# Patient Record
Sex: Female | Born: 1950 | Marital: Married | Smoking: Former smoker
Health system: Southern US, Community
[De-identification: ages and names within clinical notes are randomized; demographics above are authoritative.]

## PROBLEM LIST (undated history)

## (undated) DIAGNOSIS — E7800 Pure hypercholesterolemia, unspecified: Secondary | ICD-10-CM

## (undated) DIAGNOSIS — J189 Pneumonia, unspecified organism: Secondary | ICD-10-CM

## (undated) DIAGNOSIS — F32A Depression, unspecified: Secondary | ICD-10-CM

## (undated) DIAGNOSIS — K219 Gastro-esophageal reflux disease without esophagitis: Secondary | ICD-10-CM

## (undated) DIAGNOSIS — R519 Headache, unspecified: Secondary | ICD-10-CM

## (undated) DIAGNOSIS — M1990 Unspecified osteoarthritis, unspecified site: Secondary | ICD-10-CM

## (undated) DIAGNOSIS — I10 Essential (primary) hypertension: Secondary | ICD-10-CM

## (undated) DIAGNOSIS — I2510 Atherosclerotic heart disease of native coronary artery without angina pectoris: Secondary | ICD-10-CM

## (undated) DIAGNOSIS — I219 Acute myocardial infarction, unspecified: Secondary | ICD-10-CM

## (undated) HISTORY — PX: APPENDECTOMY: SHX54

## (undated) HISTORY — PX: EYE SURGERY: SHX253

## (undated) HISTORY — PX: DIAGNOSTIC LAPAROSCOPY: SUR761

## (undated) HISTORY — PX: ABDOMINAL HYSTERECTOMY: SHX81

## (undated) HISTORY — PX: TONSILLECTOMY: SUR1361

## (undated) HISTORY — PX: CARDIAC CATHETERIZATION: SHX172

## (undated) HISTORY — PX: CERVICAL SPINE SURGERY: SHX589

## (undated) HISTORY — PX: REFRACTIVE SURGERY: SHX103

## (undated) HISTORY — PX: FOOT FRACTURE SURGERY: SHX645

## (undated) HISTORY — PX: CHOLECYSTECTOMY: SHX55

---

## 2017-10-25 ENCOUNTER — Other Ambulatory Visit (HOSPITAL_BASED_OUTPATIENT_CLINIC_OR_DEPARTMENT_OTHER): Payer: Self-pay | Admitting: Neurosurgery

## 2017-10-25 DIAGNOSIS — M542 Cervicalgia: Secondary | ICD-10-CM

## 2018-01-23 ENCOUNTER — Other Ambulatory Visit (HOSPITAL_BASED_OUTPATIENT_CLINIC_OR_DEPARTMENT_OTHER): Payer: Self-pay

## 2020-08-25 ENCOUNTER — Other Ambulatory Visit: Payer: Self-pay | Admitting: Neurosurgery

## 2020-08-25 ENCOUNTER — Other Ambulatory Visit (HOSPITAL_COMMUNITY): Payer: Self-pay | Admitting: Neurosurgery

## 2020-08-25 DIAGNOSIS — S129XXA Fracture of neck, unspecified, initial encounter: Secondary | ICD-10-CM

## 2020-09-06 ENCOUNTER — Ambulatory Visit (HOSPITAL_COMMUNITY)
Admission: RE | Admit: 2020-09-06 | Discharge: 2020-09-06 | Disposition: A | Discharge status: Home / Self Care | Payer: Medicare Other | Source: Ambulatory Visit | Attending: Neurosurgery | Admitting: Neurosurgery

## 2020-09-06 ENCOUNTER — Other Ambulatory Visit: Payer: Self-pay

## 2020-09-06 DIAGNOSIS — S129XXA Fracture of neck, unspecified, initial encounter: Secondary | ICD-10-CM | POA: Diagnosis present

## 2020-09-15 ENCOUNTER — Other Ambulatory Visit: Payer: Self-pay | Admitting: Neurosurgery

## 2020-10-17 ENCOUNTER — Other Ambulatory Visit (HOSPITAL_COMMUNITY)
Admission: RE | Admit: 2020-10-17 | Discharge: 2020-10-17 | Disposition: A | Discharge status: Home / Self Care | Payer: Medicare Other | Source: Ambulatory Visit | Attending: Neurosurgery | Admitting: Neurosurgery

## 2020-10-17 DIAGNOSIS — Z01812 Encounter for preprocedural laboratory examination: Secondary | ICD-10-CM | POA: Diagnosis present

## 2020-10-17 DIAGNOSIS — Z20822 Contact with and (suspected) exposure to covid-19: Secondary | ICD-10-CM | POA: Diagnosis not present

## 2020-10-17 LAB — SARS CORONAVIRUS 2 (TAT 6-24 HRS): SARS Coronavirus 2: NEGATIVE

## 2020-10-19 NOTE — Anesthesia Preprocedure Evaluation (Addendum)
Anesthesia Evaluation  Patient identified by MRN, date of birth, ID band Patient awake    Reviewed: Allergy & Precautions, NPO status , Patient's Chart, lab work & pertinent test results, reviewed documented beta blocker date and time   History of Anesthesia Complications Negative for: history of anesthetic complications  Airway Mallampati: III  TM Distance: >3 FB Neck ROM: Full    Dental  (+) Missing,    Pulmonary neg pulmonary ROS,    Pulmonary exam normal        Cardiovascular hypertension, Pt. on medications and Pt. on home beta blockers Normal cardiovascular exam     Neuro/Psych Cervical stenosis    GI/Hepatic Neg liver ROS, GERD  Medicated and Controlled,  Endo/Other  negative endocrine ROS  Renal/GU negative Renal ROS  negative genitourinary   Musculoskeletal negative musculoskeletal ROS (+)   Abdominal   Peds  Hematology negative hematology ROS (+)   Anesthesia Other Findings Day of surgery medications reviewed with patient.  Reproductive/Obstetrics negative OB ROS                            Anesthesia Physical Anesthesia Plan  ASA: II  Anesthesia Plan: General   Post-op Pain Management:    Induction: Intravenous  PONV Risk Score and Plan: 4 or greater and Treatment may vary due to age or medical condition, Ondansetron and Dexamethasone  Airway Management Planned: Oral ETT  Additional Equipment: None  Intra-op Plan:   Post-operative Plan: Extubation in OR  Informed Consent: I have reviewed the patients History and Physical, chart, labs and discussed the procedure including the risks, benefits and alternatives for the proposed anesthesia with the patient or authorized representative who has indicated his/her understanding and acceptance.     Dental advisory given  Plan Discussed with: CRNA  Anesthesia Plan Comments:        Anesthesia Quick Evaluation

## 2020-10-19 NOTE — Progress Notes (Signed)
I was unable to reach patient by phone.  I left  A message on voice mail.  I instructed the patient to arrive at [facility] Main entrance at 07 30 , register in the Admitting Office. DO NOT eat or drink anything after midnight.  I instructed the patient to take the following medications in the am with just enough water to get them down:   Atorvastatin, Wellburtin, Metoprolol, Protonix, Zoloft.; if needed may take Tylenol ,Flexeril, Tramadol. I asked patient to shower with antibiotic soap,not wear any lotions, powders, cologne, jewelry, piercing, make-up or nail polish.  Wear clean clothes. Brush teeth.  I instructed  patient to call [phone], in the am if there were any questions or problems.

## 2020-10-20 ENCOUNTER — Ambulatory Visit (HOSPITAL_COMMUNITY): Payer: Medicare Other | Admitting: Certified Registered Nurse Anesthetist

## 2020-10-20 ENCOUNTER — Encounter (HOSPITAL_COMMUNITY): Payer: Self-pay | Admitting: Neurosurgery

## 2020-10-20 ENCOUNTER — Ambulatory Visit (HOSPITAL_COMMUNITY)
Admission: RE | Admit: 2020-10-20 | Discharge: 2020-10-21 | Disposition: A | Discharge status: Home / Self Care | Payer: Medicare Other | Attending: Neurosurgery | Admitting: Neurosurgery

## 2020-10-20 ENCOUNTER — Other Ambulatory Visit: Payer: Self-pay

## 2020-10-20 ENCOUNTER — Encounter (HOSPITAL_COMMUNITY)
Admission: RE | Disposition: A | Discharge status: Home / Self Care | Payer: Self-pay | Source: Home / Self Care | Attending: Neurosurgery

## 2020-10-20 ENCOUNTER — Ambulatory Visit (HOSPITAL_COMMUNITY): Payer: Medicare Other

## 2020-10-20 DIAGNOSIS — I252 Old myocardial infarction: Secondary | ICD-10-CM | POA: Diagnosis not present

## 2020-10-20 DIAGNOSIS — Z87891 Personal history of nicotine dependence: Secondary | ICD-10-CM | POA: Insufficient documentation

## 2020-10-20 DIAGNOSIS — M4802 Spinal stenosis, cervical region: Secondary | ICD-10-CM | POA: Insufficient documentation

## 2020-10-20 DIAGNOSIS — M4722 Other spondylosis with radiculopathy, cervical region: Secondary | ICD-10-CM | POA: Insufficient documentation

## 2020-10-20 DIAGNOSIS — Z791 Long term (current) use of non-steroidal anti-inflammatories (NSAID): Secondary | ICD-10-CM | POA: Insufficient documentation

## 2020-10-20 DIAGNOSIS — Z79899 Other long term (current) drug therapy: Secondary | ICD-10-CM | POA: Insufficient documentation

## 2020-10-20 DIAGNOSIS — Z419 Encounter for procedure for purposes other than remedying health state, unspecified: Secondary | ICD-10-CM

## 2020-10-20 HISTORY — DX: Pure hypercholesterolemia, unspecified: E78.00

## 2020-10-20 HISTORY — DX: Acute myocardial infarction, unspecified: I21.9

## 2020-10-20 HISTORY — DX: Essential (primary) hypertension: I10

## 2020-10-20 HISTORY — DX: Gastro-esophageal reflux disease without esophagitis: K21.9

## 2020-10-20 HISTORY — DX: Depression, unspecified: F32.A

## 2020-10-20 HISTORY — PX: ANTERIOR CERVICAL DECOMP/DISCECTOMY FUSION: SHX1161

## 2020-10-20 HISTORY — DX: Unspecified osteoarthritis, unspecified site: M19.90

## 2020-10-20 HISTORY — DX: Atherosclerotic heart disease of native coronary artery without angina pectoris: I25.10

## 2020-10-20 LAB — CBC
HCT: 37.9 % (ref 36.0–46.0)
Hemoglobin: 12.1 g/dL (ref 12.0–15.0)
MCH: 28.7 pg (ref 26.0–34.0)
MCHC: 31.9 g/dL (ref 30.0–36.0)
MCV: 90 fL (ref 80.0–100.0)
Platelets: 194 10*3/uL (ref 150–400)
RBC: 4.21 MIL/uL (ref 3.87–5.11)
RDW: 12.7 % (ref 11.5–15.5)
WBC: 6.2 10*3/uL (ref 4.0–10.5)
nRBC: 0 % (ref 0.0–0.2)

## 2020-10-20 LAB — BASIC METABOLIC PANEL
Anion gap: 9 (ref 5–15)
BUN: 17 mg/dL (ref 8–23)
CO2: 23 mmol/L (ref 22–32)
Calcium: 9.6 mg/dL (ref 8.9–10.3)
Chloride: 102 mmol/L (ref 98–111)
Creatinine, Ser: 0.94 mg/dL (ref 0.44–1.00)
GFR, Estimated: >60 mL/min (ref >60)
Glucose, Bld: 102 mg/dL — ABNORMAL HIGH (ref 70–99)
Potassium: 4.8 mmol/L (ref 3.5–5.1)
Sodium: 134 mmol/L — ABNORMAL LOW (ref 135–145)

## 2020-10-20 LAB — SURGICAL PCR SCREEN
MRSA, PCR: NEGATIVE
Staphylococcus aureus: POSITIVE — AB

## 2020-10-20 SURGERY — ANTERIOR CERVICAL DECOMPRESSION/DISCECTOMY FUSION 1 LEVEL/HARDWARE REMOVAL
Anesthesia: General

## 2020-10-20 MED ORDER — ONDANSETRON HCL 4 MG/2ML IJ SOLN
INTRAMUSCULAR | Status: AC
  Filled 2020-10-20: qty 2

## 2020-10-20 MED ORDER — ORAL CARE MOUTH RINSE: 15.0000 mL | Freq: Once | OROMUCOSAL | Status: AC

## 2020-10-20 MED ORDER — ALUM & MAG HYDROXIDE-SIMETH 200-200-20 MG/5ML PO SUSP: 30.0000 mL | Freq: Four times a day (QID) | ORAL | Status: DC | PRN

## 2020-10-20 MED ORDER — ONDANSETRON HCL 4 MG/2ML IJ SOLN
INTRAMUSCULAR | Status: DC | PRN
  Administered 2020-10-20: 4 mg via INTRAVENOUS

## 2020-10-20 MED ORDER — THROMBIN 5000 UNITS EX SOLR
OROMUCOSAL | Status: DC | PRN
  Administered 2020-10-20: 5 mL via TOPICAL

## 2020-10-20 MED ORDER — LACTATED RINGERS IV SOLN: INTRAVENOUS | Status: DC

## 2020-10-20 MED ORDER — MENTHOL 3 MG MT LOZG: 1.0000 | LOZENGE | OROMUCOSAL | Status: DC | PRN

## 2020-10-20 MED ORDER — SUGAMMADEX SODIUM 200 MG/2ML IV SOLN
INTRAVENOUS | Status: DC | PRN
  Administered 2020-10-20: 300 mg via INTRAVENOUS

## 2020-10-20 MED ORDER — ACETAMINOPHEN 325 MG PO TABS
650.0000 mg | ORAL_TABLET | ORAL | Status: DC | PRN
  Administered 2020-10-21 (×2): 650 mg via ORAL
  Filled 2020-10-20 (×2): qty 2

## 2020-10-20 MED ORDER — PROMETHAZINE HCL 25 MG/ML IJ SOLN: 6.2500 mg | INTRAMUSCULAR | Status: DC | PRN

## 2020-10-20 MED ORDER — METOPROLOL SUCCINATE ER 25 MG PO TB24: 25.0000 mg | ORAL_TABLET | Freq: Every day | ORAL | Status: DC

## 2020-10-20 MED ORDER — CHLORHEXIDINE GLUCONATE 0.12 % MT SOLN
OROMUCOSAL | Status: AC
  Administered 2020-10-20: 15 mL via OROMUCOSAL
  Filled 2020-10-20: qty 15

## 2020-10-20 MED ORDER — SERTRALINE HCL 50 MG PO TABS
50.0000 mg | ORAL_TABLET | Freq: Every day | ORAL | Status: DC
Start: 2020-10-20 — End: 2020-10-21
  Administered 2020-10-20: 50 mg via ORAL
  Filled 2020-10-20: qty 1

## 2020-10-20 MED ORDER — RISAQUAD PO CAPS
1.0000 | ORAL_CAPSULE | Freq: Every day | ORAL | Status: DC
  Filled 2020-10-20: qty 1

## 2020-10-20 MED ORDER — ONDANSETRON HCL 4 MG PO TABS: 4.0000 mg | ORAL_TABLET | Freq: Four times a day (QID) | ORAL | Status: DC | PRN

## 2020-10-20 MED ORDER — CEFAZOLIN SODIUM-DEXTROSE 2-4 GM/100ML-% IV SOLN
2.0000 g | INTRAVENOUS | Status: AC
  Administered 2020-10-20: 2 g via INTRAVENOUS

## 2020-10-20 MED ORDER — HEMOSTATIC AGENTS (NO CHARGE) OPTIME
TOPICAL | Status: DC | PRN
Start: 2020-10-20 — End: 2020-10-20
  Administered 2020-10-20: 1 via TOPICAL

## 2020-10-20 MED ORDER — BUPROPION HCL ER (XL) 150 MG PO TB24
150.0000 mg | ORAL_TABLET | Freq: Every day | ORAL | Status: DC
  Filled 2020-10-20: qty 1

## 2020-10-20 MED ORDER — PROPOFOL 10 MG/ML IV BOLUS
INTRAVENOUS | Status: AC
  Filled 2020-10-20: qty 20

## 2020-10-20 MED ORDER — ROCURONIUM BROMIDE 10 MG/ML (PF) SYRINGE
PREFILLED_SYRINGE | INTRAVENOUS | Status: DC | PRN
  Administered 2020-10-20: 50 mg via INTRAVENOUS
  Administered 2020-10-20: 30 mg via INTRAVENOUS

## 2020-10-20 MED ORDER — SODIUM CHLORIDE 0.9 % IV SOLN: 250.0000 mL | INTRAVENOUS | Status: DC

## 2020-10-20 MED ORDER — FENTANYL CITRATE (PF) 250 MCG/5ML IJ SOLN
INTRAMUSCULAR | Status: DC | PRN
  Administered 2020-10-20 (×2): 50 ug via INTRAVENOUS
  Administered 2020-10-20: 100 ug via INTRAVENOUS
  Administered 2020-10-20: 50 ug via INTRAVENOUS

## 2020-10-20 MED ORDER — OXYCODONE HCL 5 MG PO TABS
5.0000 mg | ORAL_TABLET | Freq: Once | ORAL | Status: DC | PRN
Start: 2020-10-20 — End: 2020-10-20

## 2020-10-20 MED ORDER — SODIUM CHLORIDE 0.9% FLUSH
3.0000 mL | Freq: Two times a day (BID) | INTRAVENOUS | Status: DC
  Administered 2020-10-20: 3 mL via INTRAVENOUS

## 2020-10-20 MED ORDER — 0.9 % SODIUM CHLORIDE (POUR BTL) OPTIME
TOPICAL | Status: DC | PRN
  Administered 2020-10-20: 1000 mL

## 2020-10-20 MED ORDER — PHENOL 1.4 % MT LIQD: 1.0000 | OROMUCOSAL | Status: DC | PRN

## 2020-10-20 MED ORDER — PANTOPRAZOLE SODIUM 40 MG IV SOLR
40.0000 mg | Freq: Every day | INTRAVENOUS | Status: DC
  Administered 2020-10-20: 40 mg via INTRAVENOUS
  Filled 2020-10-20: qty 40

## 2020-10-20 MED ORDER — THROMBIN 5000 UNITS EX KIT
PACK | CUTANEOUS | Status: AC
  Filled 2020-10-20: qty 1

## 2020-10-20 MED ORDER — CHLORHEXIDINE GLUCONATE CLOTH 2 % EX PADS: 6.0000 | MEDICATED_PAD | Freq: Once | CUTANEOUS | Status: DC

## 2020-10-20 MED ORDER — CHLORHEXIDINE GLUCONATE 0.12 % MT SOLN: 15.0000 mL | Freq: Once | OROMUCOSAL | Status: AC

## 2020-10-20 MED ORDER — PHENYLEPHRINE HCL-NACL 10-0.9 MG/250ML-% IV SOLN
INTRAVENOUS | Status: DC | PRN
  Administered 2020-10-20: 25 ug/min via INTRAVENOUS

## 2020-10-20 MED ORDER — OXYCODONE HCL 5 MG PO TABS
10.0000 mg | ORAL_TABLET | ORAL | Status: DC | PRN
  Administered 2020-10-20 – 2020-10-21 (×6): 10 mg via ORAL
  Filled 2020-10-20 (×6): qty 2

## 2020-10-20 MED ORDER — FENTANYL CITRATE (PF) 100 MCG/2ML IJ SOLN
25.0000 ug | INTRAMUSCULAR | Status: DC | PRN
  Administered 2020-10-20 (×2): 25 ug via INTRAVENOUS

## 2020-10-20 MED ORDER — THROMBIN 5000 UNITS EX KIT
PACK | CUTANEOUS | Status: AC
  Filled 2020-10-20: qty 2

## 2020-10-20 MED ORDER — ZOLPIDEM TARTRATE 5 MG PO TABS
5.0000 mg | ORAL_TABLET | Freq: Every day | ORAL | Status: DC
  Administered 2020-10-20: 5 mg via ORAL
  Filled 2020-10-20: qty 1

## 2020-10-20 MED ORDER — PHENYLEPHRINE 40 MCG/ML (10ML) SYRINGE FOR IV PUSH (FOR BLOOD PRESSURE SUPPORT)
PREFILLED_SYRINGE | INTRAVENOUS | Status: AC
  Filled 2020-10-20: qty 10

## 2020-10-20 MED ORDER — THROMBIN 5000 UNITS EX SOLR
CUTANEOUS | Status: DC | PRN
  Administered 2020-10-20 (×2): 5000 [IU] via TOPICAL

## 2020-10-20 MED ORDER — ROCURONIUM BROMIDE 10 MG/ML (PF) SYRINGE
PREFILLED_SYRINGE | INTRAVENOUS | Status: AC
  Filled 2020-10-20: qty 10

## 2020-10-20 MED ORDER — MELOXICAM 7.5 MG PO TABS: 15.0000 mg | ORAL_TABLET | Freq: Every day | ORAL | Status: DC

## 2020-10-20 MED ORDER — CYCLOBENZAPRINE HCL 10 MG PO TABS: 10.0000 mg | ORAL_TABLET | Freq: Two times a day (BID) | ORAL | Status: DC | PRN

## 2020-10-20 MED ORDER — TRAMADOL HCL 50 MG PO TABS: 50.0000 mg | ORAL_TABLET | Freq: Four times a day (QID) | ORAL | Status: DC | PRN

## 2020-10-20 MED ORDER — CEFAZOLIN SODIUM-DEXTROSE 2-4 GM/100ML-% IV SOLN
2.0000 g | Freq: Three times a day (TID) | INTRAVENOUS | Status: AC
  Administered 2020-10-20 – 2020-10-21 (×2): 2 g via INTRAVENOUS
  Filled 2020-10-20 (×2): qty 100

## 2020-10-20 MED ORDER — PANTOPRAZOLE SODIUM 20 MG PO TBEC: 20.0000 mg | DELAYED_RELEASE_TABLET | Freq: Every day | ORAL | Status: DC

## 2020-10-20 MED ORDER — PROPOFOL 10 MG/ML IV BOLUS
INTRAVENOUS | Status: DC | PRN
  Administered 2020-10-20: 200 mg via INTRAVENOUS

## 2020-10-20 MED ORDER — FENTANYL CITRATE (PF) 250 MCG/5ML IJ SOLN
INTRAMUSCULAR | Status: AC
  Filled 2020-10-20: qty 5

## 2020-10-20 MED ORDER — SODIUM CHLORIDE 0.9% FLUSH: 3.0000 mL | INTRAVENOUS | Status: DC | PRN

## 2020-10-20 MED ORDER — CYCLOBENZAPRINE HCL 10 MG PO TABS
10.0000 mg | ORAL_TABLET | Freq: Three times a day (TID) | ORAL | Status: DC | PRN
  Administered 2020-10-20 – 2020-10-21 (×3): 10 mg via ORAL
  Filled 2020-10-20 (×3): qty 1

## 2020-10-20 MED ORDER — FENTANYL CITRATE (PF) 100 MCG/2ML IJ SOLN
INTRAMUSCULAR | Status: AC
  Administered 2020-10-20: 50 ug via INTRAVENOUS
  Filled 2020-10-20: qty 2

## 2020-10-20 MED ORDER — LIDOCAINE 2% (20 MG/ML) 5 ML SYRINGE
INTRAMUSCULAR | Status: AC
  Filled 2020-10-20: qty 5

## 2020-10-20 MED ORDER — LISINOPRIL 10 MG PO TABS: 10.0000 mg | ORAL_TABLET | Freq: Every day | ORAL | Status: DC

## 2020-10-20 MED ORDER — CEFAZOLIN SODIUM-DEXTROSE 2-4 GM/100ML-% IV SOLN
INTRAVENOUS | Status: AC
  Filled 2020-10-20: qty 100

## 2020-10-20 MED ORDER — VITAMIN D 25 MCG (1000 UNIT) PO TABS: 1000.0000 [IU] | ORAL_TABLET | Freq: Every day | ORAL | Status: DC

## 2020-10-20 MED ORDER — HYDROMORPHONE HCL 1 MG/ML IJ SOLN
0.5000 mg | INTRAMUSCULAR | Status: DC | PRN
  Administered 2020-10-20: 0.5 mg via INTRAVENOUS
  Filled 2020-10-20: qty 0.5

## 2020-10-20 MED ORDER — ACETAMINOPHEN 500 MG PO TABS
1000.0000 mg | ORAL_TABLET | Freq: Once | ORAL | Status: AC
  Administered 2020-10-20: 1000 mg via ORAL
  Filled 2020-10-20: qty 2

## 2020-10-20 MED ORDER — ACETAMINOPHEN 650 MG RE SUPP
650.0000 mg | RECTAL | Status: DC | PRN
Start: 2020-10-20 — End: 2020-10-21

## 2020-10-20 MED ORDER — OXYCODONE HCL 5 MG/5ML PO SOLN
5.0000 mg | Freq: Once | ORAL | Status: DC | PRN
Start: 2020-10-20 — End: 2020-10-20

## 2020-10-20 MED ORDER — ATORVASTATIN CALCIUM 10 MG PO TABS: 20.0000 mg | ORAL_TABLET | Freq: Every day | ORAL | Status: DC

## 2020-10-20 MED ORDER — PHENYLEPHRINE 40 MCG/ML (10ML) SYRINGE FOR IV PUSH (FOR BLOOD PRESSURE SUPPORT)
PREFILLED_SYRINGE | INTRAVENOUS | Status: DC | PRN
  Administered 2020-10-20 (×3): 80 ug via INTRAVENOUS

## 2020-10-20 MED ORDER — ONDANSETRON HCL 4 MG/2ML IJ SOLN: 4.0000 mg | Freq: Four times a day (QID) | INTRAMUSCULAR | Status: DC | PRN

## 2020-10-20 MED ORDER — DEXAMETHASONE SODIUM PHOSPHATE 10 MG/ML IJ SOLN
10.0000 mg | Freq: Once | INTRAMUSCULAR | Status: AC
  Administered 2020-10-20: 10 mg via INTRAVENOUS

## 2020-10-20 MED ORDER — ACETAMINOPHEN 325 MG PO TABS: 650.0000 mg | ORAL_TABLET | Freq: Four times a day (QID) | ORAL | Status: DC | PRN

## 2020-10-20 MED ORDER — CEFAZOLIN SODIUM-DEXTROSE 2-4 GM/100ML-% IV SOLN: 2.0000 g | Freq: Three times a day (TID) | INTRAVENOUS | Status: DC

## 2020-10-20 MED ORDER — LIDOCAINE 2% (20 MG/ML) 5 ML SYRINGE
INTRAMUSCULAR | Status: DC | PRN
  Administered 2020-10-20: 100 mg via INTRAVENOUS

## 2020-10-20 MED ORDER — DEXAMETHASONE SODIUM PHOSPHATE 10 MG/ML IJ SOLN
INTRAMUSCULAR | Status: AC
  Filled 2020-10-20: qty 1

## 2020-10-20 MED ORDER — IBUPROFEN 200 MG PO TABS: 400.0000 mg | ORAL_TABLET | Freq: Three times a day (TID) | ORAL | Status: DC | PRN

## 2020-10-20 SURGICAL SUPPLY — 58 items
BAND RUBBER #18 3X1/16 STRL (MISCELLANEOUS) ×4 IMPLANT
BASKET BONE COLLECTION (BASKET) ×2 IMPLANT
BENZOIN TINCTURE PRP APPL 2/3 (GAUZE/BANDAGES/DRESSINGS) ×2 IMPLANT
BIT DRILL NEURO 2X3.1 SFT TUCH (MISCELLANEOUS) ×1 IMPLANT
BONE VIVIGEN FORMABLE 1.3CC (Bone Implant) ×2 IMPLANT
BUR MATCHSTICK NEURO 3.0 LAGG (BURR) ×2 IMPLANT
CANISTER SUCT 3000ML PPV (MISCELLANEOUS) ×2 IMPLANT
CARTRIDGE OIL MAESTRO DRILL (MISCELLANEOUS) ×1 IMPLANT
COVER WAND RF STERILE (DRAPES) ×2 IMPLANT
DECANTER SPIKE VIAL GLASS SM (MISCELLANEOUS) ×2 IMPLANT
DERMABOND ADVANCED (GAUZE/BANDAGES/DRESSINGS)
DERMABOND ADVANCED .7 DNX12 (GAUZE/BANDAGES/DRESSINGS) IMPLANT
DIFFUSER DRILL AIR PNEUMATIC (MISCELLANEOUS) ×2 IMPLANT
DRAPE C-ARM 42X72 X-RAY (DRAPES) ×4 IMPLANT
DRAPE LAPAROTOMY 100X72 PEDS (DRAPES) ×2 IMPLANT
DRAPE MICROSCOPE LEICA (MISCELLANEOUS) ×2 IMPLANT
DRILL NEURO 2X3.1 SOFT TOUCH (MISCELLANEOUS) ×2
DRSG OPSITE POSTOP 4X6 (GAUZE/BANDAGES/DRESSINGS) ×2 IMPLANT
DURAPREP 6ML APPLICATOR 50/CS (WOUND CARE) ×2 IMPLANT
ELECT COATED BLADE 2.86 ST (ELECTRODE) ×2 IMPLANT
ELECT REM PT RETURN 9FT ADLT (ELECTROSURGICAL) ×2
ELECTRODE REM PT RTRN 9FT ADLT (ELECTROSURGICAL) ×1 IMPLANT
GAUZE 4X4 16PLY RFD (DISPOSABLE) IMPLANT
GAUZE SPONGE 4X4 12PLY STRL (GAUZE/BANDAGES/DRESSINGS) ×2 IMPLANT
GLOVE BIO SURGEON STRL SZ7 (GLOVE) ×2 IMPLANT
GLOVE BIO SURGEON STRL SZ8 (GLOVE) ×2 IMPLANT
GLOVE EXAM NITRILE XL STR (GLOVE) IMPLANT
GLOVE INDICATOR 8.5 STRL (GLOVE) ×2 IMPLANT
GLOVE SURG UNDER POLY LF SZ7 (GLOVE) ×2 IMPLANT
GOWN STRL REUS W/ TWL LRG LVL3 (GOWN DISPOSABLE) ×3 IMPLANT
GOWN STRL REUS W/ TWL XL LVL3 (GOWN DISPOSABLE) ×1 IMPLANT
GOWN STRL REUS W/TWL 2XL LVL3 (GOWN DISPOSABLE) ×2 IMPLANT
GOWN STRL REUS W/TWL LRG LVL3 (GOWN DISPOSABLE) ×3
GOWN STRL REUS W/TWL XL LVL3 (GOWN DISPOSABLE) ×1
HALTER HD/CHIN CERV TRACTION D (MISCELLANEOUS) ×2 IMPLANT
HEMOSTAT POWDER KIT SURGIFOAM (HEMOSTASIS) ×2 IMPLANT
KIT BASIN OR (CUSTOM PROCEDURE TRAY) ×2 IMPLANT
KIT TURNOVER KIT B (KITS) ×2 IMPLANT
NEEDLE HYPO 18GX1.5 BLUNT FILL (NEEDLE) ×2 IMPLANT
NEEDLE SPNL 20GX3.5 QUINCKE YW (NEEDLE) ×2 IMPLANT
NS IRRIG 1000ML POUR BTL (IV SOLUTION) ×4 IMPLANT
OIL CARTRIDGE MAESTRO DRILL (MISCELLANEOUS) ×2
PACK LAMINECTOMY NEURO (CUSTOM PROCEDURE TRAY) ×2 IMPLANT
PAD ARMBOARD 7.5X6 YLW CONV (MISCELLANEOUS) IMPLANT
PIN DISTRACTION 14MM (PIN) ×2 IMPLANT
PLATE ANT CERV XTEND 1 LV 16 (Plate) ×2 IMPLANT
RASP 3.0MM (RASP) ×2 IMPLANT
SCREW VAR 4.2 XD SELF DRILL 14 (Screw) ×8 IMPLANT
SPACER HEDRON C 12X14X7 0D (Spacer) ×2 IMPLANT
SPONGE INTESTINAL PEANUT (DISPOSABLE) ×4 IMPLANT
SPONGE SURGIFOAM ABS GEL SZ50 (HEMOSTASIS) ×2 IMPLANT
STRIP CLOSURE SKIN 1/2X4 (GAUZE/BANDAGES/DRESSINGS) ×2 IMPLANT
SUT VIC AB 3-0 SH 8-18 (SUTURE) ×2 IMPLANT
SUT VICRYL 4-0 PS2 18IN ABS (SUTURE) ×2 IMPLANT
TAPE CLOTH 4X10 WHT NS (GAUZE/BANDAGES/DRESSINGS) ×2 IMPLANT
TOWEL GREEN STERILE (TOWEL DISPOSABLE) ×2 IMPLANT
TOWEL GREEN STERILE FF (TOWEL DISPOSABLE) ×2 IMPLANT
WATER STERILE IRR 1000ML POUR (IV SOLUTION) ×2 IMPLANT

## 2020-10-20 NOTE — H&P (Signed)
Tammie Vasquez is an 70 y.o. female.   Chief Complaint: Neck pain right greater than left arm pain HPI: 70 year old female with longstanding issues with her neck previously undergone ACDF C4-C6 presents now with progressive worsening neck pain and C7 radicular symptoms.  Work-up revealed progressive and degenerative breakdown below her fusion at C6-7.  Due to her progression of clinical syndrome imaging findings and failed conservative treatment I recommended an ACDF at C6-7 with either removal of hardware or cutting the inferior plate to make room for the new plate.  I have extensively gone over the risks and benefits of that procedure with her as well as perioperative course expectations of outcome and alternatives of surgery and she understands and agrees to proceed forward.  Past Medical History:  Diagnosis Date   Arthritis    Coronary artery disease    Depression    GERD (gastroesophageal reflux disease)    High cholesterol    Hypertension    Myocardial infarction [facility])     Past Surgical History:  Procedure Laterality Date   ABDOMINAL HYSTERECTOMY     APPENDECTOMY     CARDIAC CATHETERIZATION     stent placed at age 71   CERVICAL SPINE SURGERY     CHOLECYSTECTOMY     FOOT FRACTURE SURGERY Left    REFRACTIVE SURGERY     TONSILLECTOMY      History reviewed. No pertinent family history. Social History:  reports that she quit smoking about 9 years ago. Her smoking use included cigarettes. She has never used smokeless tobacco. She reports current alcohol use. She reports that she does not use drugs.  Allergies: No Known Allergies  Medications Prior to Admission  Medication Sig Dispense Refill   atorvastatin (LIPITOR) 20 MG tablet Take 20 mg by mouth daily.     buPROPion (WELLBUTRIN XL) 150 MG 24 hr tablet Take 150 mg by mouth daily.     cholecalciferol (VITAMIN D3) 25 MCG (1000 UNIT) tablet Take 1,000 Units by mouth daily.     cyclobenzaprine (FLEXERIL) 10 MG  tablet Take 10 mg by mouth 2 (two) times daily as needed for muscle spasms.     ibuprofen (ADVIL) 200 MG tablet Take 400 mg by mouth every 8 (eight) hours as needed for mild pain.     lisinopril (ZESTRIL) 10 MG tablet Take 10 mg by mouth daily.     meloxicam (MOBIC) 15 MG tablet Take 15 mg by mouth daily.     metoprolol succinate (TOPROL-XL) 25 MG 24 hr tablet Take 25 mg by mouth daily.     pantoprazole (PROTONIX) 20 MG tablet Take 20 mg by mouth daily.     Probiotic Product (PROBIOTIC PO) Take 1 capsule by mouth daily.     sertraline (ZOLOFT) 100 MG tablet Take 50 mg by mouth daily.     traMADol (ULTRAM) 50 MG tablet Take 50 mg by mouth every 6 (six) hours as needed for moderate pain.     zolpidem (AMBIEN) 10 MG tablet Take 10 mg by mouth at bedtime.     acetaminophen (TYLENOL) 325 MG tablet Take 650 mg by mouth every 6 (six) hours as needed for moderate pain or headache.      Results for orders placed or performed during the hospital encounter of 10/20/20 (from the past 48 hour(s))  Basic metabolic panel per protocol     Status: Abnormal   Collection Time: 10/20/20  9:06 AM  Result Value Ref Range   Sodium 134 (L) 135 -  145 mmol/L   Potassium 4.8 3.5 - 5.1 mmol/L   Chloride 102 98 - 111 mmol/L   CO2 23 22 - 32 mmol/L   Glucose, Bld 102 (H) 70 - 99 mg/dL    Comment: Glucose reference range applies only to samples taken after fasting for at least 8 hours.   BUN 17 8 - 23 mg/dL   Creatinine, Ser 1.76 0.44 - 1.00 mg/dL   Calcium 9.6 8.9 - 16.0 mg/dL   GFR, Estimated >73 >71 mL/min    Comment: (NOTE) Calculated using the CKD-EPI Creatinine Equation (2021)    Anion gap 9 5 - 15    Comment: Performed at [facility], [address], [place], [place] [address]  CBC per protocol     Status: None   Collection Time: 10/20/20  9:06 AM  Result Value Ref Range   WBC 6.2 4.0 - 10.5 K/uL   RBC 4.21 3.87 - 5.11 MIL/uL   Hemoglobin 12.1 12.0 - 15.0 g/dL   HCT 48.5 46.2 - 70.3 %    MCV 90.0 80.0 - 100.0 fL   MCH 28.7 26.0 - 34.0 pg   MCHC 31.9 30.0 - 36.0 g/dL   RDW 50.0 93.8 - 18.2 %   Platelets 194 150 - 400 K/uL   nRBC 0.0 0.0 - 0.2 %    Comment: Performed at [facility], [address], [place], [place] [address]   No results found.  Review of Systems  Musculoskeletal: Positive for neck pain.  Neurological: Positive for weakness and numbness.    Blood pressure (!) 142/55, pulse 72, temperature (!) 97.2 F (36.2 C), temperature source Oral, resp. rate 18, height 5\' 3"  (1.6 m), weight 84.4 kg, SpO2 98 %. Physical Exam HENT:     Head: Normocephalic.     Right Ear: Tympanic membrane normal.     Nose: Nose normal.  Eyes:     Pupils: Pupils are equal, round, and reactive to light.  Cardiovascular:     Rate and Rhythm: Normal rate.  Pulmonary:     Effort: Pulmonary effort is normal.  Abdominal:     General: Abdomen is flat.  Musculoskeletal:        General: Normal range of motion.  Skin:    General: Skin is warm.  Neurological:     General: No focal deficit present.     Mental Status: She is alert.     Comments: Patient is awake and alert strength is 5 and 5 deltoid, bicep, tricep, wrist flexion, wrist extension, hand intrinsics.      Assessment/Plan 70 year old presents for ACDF C6-7  62, MD 10/20/2020, 10:05 AM

## 2020-10-20 NOTE — Op Note (Signed)
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  Postoperative diagnosis: Same  Procedure: Exploration fusion removal of the inferior aspect of the plate by cutting the plate below the C5 screws and anterior cervical discectomy and fusion at C6-7 utilizing the globus [place] titanium cage was packed with locally harvested autograft mixed with vivigen and anterior cervical plating utilizing the globus extend plating system with 4-14 mm self drilling screws  Surgeon: Jillyn Hidden Kirin Brandenburger  Assistant: Julien Girt  Anesthesia: General  EBL: Minimal  HPI: 70 year old female previous C4-C6 fusion did very well last several weeks months has had progressive worsening neck pain bilateral arm pain consistent with a C7 radicular pattern work-up revealed progressive degenerative collapse at C6-7 with spondylitic ridging spinal cord compression and foraminal stenosis at that level.  CT scan showed possible pseudoarthrosis at C4-5 with slight haloing around the C4 screws although it did appear to me that there was an incorporation of the interbody implant.  However due to the appearance on CT scan being questionable I recommended leaving the plate behind at C4-5 and cutting the plate below the C5 screws as there was solid C5-6 fusion on CT.  Then that I may do an ACDF at C6-7 with its own small plate.  I extensively over the risks and benefits of the operation with her as well as perioperative course expectations of outcome and alternatives of surgery and she understood and agreed to proceed forward.  Operative procedure: Patient was brought into the OR was additional general anesthesia positioned supine neck in slight extension 5 pounds halter traction the right side of her neck was prepped and draped in routine sterile fashion her old incision lined up correctly so utilizing old incision so a curvilinear incision was made just off the midline to the  anterior border of the sternocleidomastoid and superficial layer of platysma was dissected out divided longitudinally the avascular plane between the scar tissue was developed down to the prevertebral fascia which was dissected away with Kitners.  Identified immediately the old plate and dissected the inferior aspect the old plate just below the C5 screws for a at this point I drilled off the plate utilizing a titanium cutting drill bit inferior to the C5 screws.  Then remove the inferior aspect the plate and screws then identified the disc base and there was a large anterior ossified that was [place] away the inferior old C6 screw holes had subsided into the disc base so these were squared off and removed.  I then utilized upgoing curette and scraped off the endplates and drilled down the disc base under microscopic lamination capturing the bone shavings and mucus trap.  There was a large posterior spurs these were all removed extensively aggressively under biting both endplates and decompressing central canal marching laterally both C7 pedicles were identified both C7 nerve roots were skeletonized flush with the pedicle.  At the end of discectomy there is no further stenosis either centrally or foraminally.  Then prepared the endplates sized up a 7 mm parallel cage packed with locally harvested autograft mixed with the vivigen impacted 1 to 2 mm deep to the anterior vertebral line head and selected a 32mm globus extend plate drilled for new holes and placed for 14 mm self-sealing screws all screws had excellent purchase and locking mechanism was engaged.  Wound was then copiously irrigated meticulous hemostasis was maintained and the wound was closed in layers with active Vicryl in a running 4 subcuticular Dermabond benzoin Steri-Strips  and a sterile dressing was applied patient recovery in stable condition.  At the end the case all needle counts and sponge counts were correct.

## 2020-10-20 NOTE — Transfer of Care (Signed)
Immediate Anesthesia Transfer of Care Note  Patient: Tammie Vasquez  Procedure(s) Performed: ANTERIOR CERVICAL DECOMPRESSION FUSION  - CERVICAL SIX-CERVICAL SEVEN WITH EXPLORATION REMOVAL HARDWARE  CERVICAL FOUR-CERVICAL SIX (N/A )  Patient Location: PACU  Anesthesia Type:General  Level of Consciousness: awake and patient cooperative  Airway & Oxygen Therapy: Patient Spontanous Breathing and Patient connected to face mask oxygen  Post-op Assessment: Report given to RN and Post -op Vital signs reviewed and stable  Post vital signs: Reviewed and stable  Last Vitals:  Vitals Value Taken Time  BP 118/54 10/20/20 1220  Temp    Pulse 80 10/20/20 1226  Resp 13 10/20/20 1226  SpO2 94 % 10/20/20 1226  Vitals shown include unvalidated device data.  Last Pain:  Vitals:   10/20/20 0610  TempSrc: Oral         Complications: No complications documented.

## 2020-10-20 NOTE — Anesthesia Procedure Notes (Signed)
Procedure Name: Intubation Date/Time: 10/20/2020 10:24 AM Performed by: Adria Dill, CRNA Pre-anesthesia Checklist: Patient identified, Emergency Drugs available, Suction available and Patient being monitored Patient Re-evaluated:Patient Re-evaluated prior to induction Oxygen Delivery Method: Circle system utilized Preoxygenation: Pre-oxygenation with 100% oxygen Induction Type: IV induction Ventilation: Mask ventilation without difficulty Laryngoscope Size: Glidescope and 3 Grade View: Grade I Tube type: Oral Tube size: 7.0 mm Number of attempts: 1 Airway Equipment and Method: Stylet and Oral airway Placement Confirmation: ETT inserted through vocal cords under direct vision,  positive ETCO2 and breath sounds checked- equal and bilateral Secured at: 21 cm Tube secured with: Tape Dental Injury: Teeth and Oropharynx as per pre-operative assessment  Difficulty Due To: Difficult Airway- due to reduced neck mobility Future Recommendations: Recommend- induction with short-acting agent, and alternative techniques readily available

## 2020-10-20 NOTE — Evaluation (Signed)
Physical Therapy Evaluation & Discharge Patient Details Name: Tammie Vasquez MRN: [id number] DOB: 12/11/1950 Today's Date: 10/20/2020   History of Present Illness  70 y/o female s/p ACDF C6-7 on 3/4. PMH: previous ACDF C4-6, CAD, depression, GERD, HTN, HLD, MI  Clinical Impression  PTA, patient lives with husband and reports independence with mobility. Patient overall modI for mobility with no AD. Patient negotiated 5 stairs with R handrail with no difficulty. No skilled PT needs required acutely. No PT follow up recommended at this time.     Follow Up Recommendations No PT follow up    Equipment Recommendations  None recommended by PT    Recommendations for Other Services       Precautions / Restrictions Precautions Precautions: Cervical Precaution Booklet Issued: Yes (comment) Required Braces or Orthoses: Cervical Brace Cervical Brace: Soft collar;At all times Restrictions Weight Bearing Restrictions: No      Mobility  Bed Mobility Overal bed mobility: Modified Independent                  Transfers Overall transfer level: Modified independent Equipment used: None                Ambulation/Gait Ambulation/Gait assistance: Modified independent (Device/Increase time) Gait Distance (Feet): 250 Feet Assistive device: None Gait Pattern/deviations: WFL(Within Functional Limits) Gait velocity: normal Gait velocity interpretation: >4.37 ft/sec, indicative of normal walking speed    Stairs Stairs: Yes Stairs assistance: Modified independent (Device/Increase time) Stair Management: One rail Right;Step to pattern;Forwards Number of Stairs: 5    Wheelchair Mobility    Modified Rankin (Stroke Patients Only)       Balance Overall balance assessment: No apparent balance deficits (not formally assessed)                                           Pertinent Vitals/Pain Pain Assessment: Faces Faces Pain Scale: No hurt    Home Living  Family/patient expects to be discharged to:: Private residence Living Arrangements: Spouse/significant other Available Help at Discharge: Family;Available 24 hours/day Type of Home: House Home Access: Level entry     Home Layout: Two level;Bed/bath upstairs Home Equipment: Shower seat - built in;[place] - 2 wheels      Prior Function Level of Independence: Independent               Hand Dominance        Extremity/Trunk Assessment   Upper Extremity Assessment Upper Extremity Assessment: Defer to OT evaluation    Lower Extremity Assessment Lower Extremity Assessment: Overall WFL for tasks assessed       Communication   Communication: No difficulties  Cognition Arousal/Alertness: Awake/alert Behavior During Therapy: WFL for tasks assessed/performed Overall Cognitive Status: Within Functional Limits for tasks assessed                                        General Comments      Exercises     Assessment/Plan    PT Assessment Patent does not need any further PT services  PT Problem List         PT Treatment Interventions      PT Goals (Current goals can be found in the Care Plan section)  Acute Rehab PT Goals Patient Stated Goal: to go home PT Goal Formulation:  With patient    Frequency     Barriers to discharge        Co-evaluation               AM-PAC PT "6 Clicks" Mobility  Outcome Measure Help needed turning from your back to your side while in a flat bed without using bedrails?: None Help needed moving from lying on your back to sitting on the side of a flat bed without using bedrails?: None Help needed moving to and from a bed to a chair (including a wheelchair)?: None Help needed standing up from a chair using your arms (e.g., wheelchair or bedside chair)?: None Help needed to walk in hospital room?: None Help needed climbing 3-5 steps with a railing? : None 6 Click Score: 24    End of Session Equipment Utilized  During Treatment: Cervical collar Activity Tolerance: Patient tolerated treatment well Patient left: in bed;with call bell/phone within reach Nurse Communication: Mobility status PT Visit Diagnosis: Muscle weakness (generalized) (M62.81)    Time: 9381-8299 PT Time Calculation (min) (ACUTE ONLY): 14 min   Charges:   PT Evaluation $PT Eval Low Complexity: 1 Low          Sostenes Kauffmann A. Dan Humphreys PT, DPT Acute Rehabilitation Services Pager [phone] Office [phone]   Viviann Spare 10/20/2020, 4:52 PM

## 2020-10-20 NOTE — Anesthesia Postprocedure Evaluation (Signed)
Anesthesia Post Note  Patient: Tammie Vasquez  Procedure(s) Performed: ANTERIOR CERVICAL DECOMPRESSION FUSION  - CERVICAL SIX-CERVICAL SEVEN WITH EXPLORATION REMOVAL HARDWARE  CERVICAL FOUR-CERVICAL SIX (N/A )     Patient location during evaluation: PACU Anesthesia Type: General Level of consciousness: awake and alert and oriented Pain management: pain level controlled Vital Signs Assessment: post-procedure vital signs reviewed and stable Respiratory status: spontaneous breathing, nonlabored ventilation and respiratory function stable Cardiovascular status: blood pressure returned to baseline Postop Assessment: no apparent nausea or vomiting Anesthetic complications: no   No complications documented.  Last Vitals:  Vitals:   10/20/20 1335 10/20/20 1405  BP: 111/86   Pulse: 79 80  Resp: 18 20  Temp:  36.7 C  SpO2: 93% 98%               Kaylyn Layer

## 2020-10-21 DIAGNOSIS — M4802 Spinal stenosis, cervical region: Secondary | ICD-10-CM | POA: Diagnosis not present

## 2020-10-21 MED ORDER — OXYCODONE HCL 10 MG PO TABS: 10.0000 mg | ORAL_TABLET | Freq: Four times a day (QID) | ORAL | 0 refills | Status: DC | PRN

## 2020-10-21 NOTE — Progress Notes (Signed)
Patient is discharged from room 3C05 at this time. Alert and in stable condition. IV site d/c'd and instructions read to patient and sister in-law with understanding verbalized and all questions answered. Left unit via wheelchair with all belongings at side.

## 2020-10-21 NOTE — Discharge Summary (Signed)
Physician Discharge Summary  Patient ID: Tammie Vasquez MRN: [id number] DOB/AGE: May 12, 1951 70 y.o.  Admit date: 10/20/2020 Discharge date: 10/21/2020  Admission Diagnoses:  Cervical stenosis  Discharge Diagnoses:  Same Active Problems:   Spinal stenosis in cervical region   Discharged Condition: Stable  Hospital Course:  Tammie Vasquez is a 70 y.o. female admitted after elective ACDF C6-7. She was reporting appropriate neck pain, ambulating well, tolerating diet without significant difficulty swallowing and voiding normally. She was therefore discharged in stable condition.   Treatments: Surgery - ACDF C6-7  Discharge Exam: Blood pressure (!) 108/58, pulse 86, temperature 98.1 F (36.7 C), temperature source Oral, resp. rate 18, height 5\' 3"  (1.6 m), weight 84.4 kg, SpO2 98 %. Awake, alert, oriented Speech fluent, appropriate CN grossly intact 5/5 BUE/BLE Wound c/d/i  Disposition: Discharge disposition: 01-Home or Self Care       Discharge Instructions    Call MD for:  redness, tenderness, or signs of infection (pain, swelling, redness, odor or green/yellow discharge around incision site)   Complete by: As directed    Call MD for:  temperature >100.4   Complete by: As directed    Diet - low sodium heart healthy   Complete by: As directed    Discharge instructions   Complete by: As directed    Walk at home as much as possible, at least 4 times / day   Increase activity slowly   Complete by: As directed    Lifting restrictions   Complete by: As directed    No lifting > 10 lbs   May shower / Bathe   Complete by: As directed    48 hours after surgery   May walk up steps   Complete by: As directed    Other Restrictions   Complete by: As directed    No bending/twisting at waist   Remove dressing in 48 hours   Complete by: As directed      Allergies as of 10/21/2020   No Known Allergies     Medication List    STOP taking these medications   traMADol 50 MG  tablet Commonly known as: ULTRAM     TAKE these medications   acetaminophen 325 MG tablet Commonly known as: TYLENOL Take 650 mg by mouth every 6 (six) hours as needed for moderate pain or headache.   atorvastatin 20 MG tablet Commonly known as: LIPITOR Take 20 mg by mouth daily.   buPROPion 150 MG 24 hr tablet Commonly known as: WELLBUTRIN XL Take 150 mg by mouth daily.   cholecalciferol 25 MCG (1000 UNIT) tablet Commonly known as: VITAMIN D3 Take 1,000 Units by mouth daily.   cyclobenzaprine 10 MG tablet Commonly known as: FLEXERIL Take 10 mg by mouth 2 (two) times daily as needed for muscle spasms.   ibuprofen 200 MG tablet Commonly known as: ADVIL Take 400 mg by mouth every 8 (eight) hours as needed for mild pain.   lisinopril 10 MG tablet Commonly known as: ZESTRIL Take 10 mg by mouth daily.   meloxicam 15 MG tablet Commonly known as: MOBIC Take 15 mg by mouth daily.   metoprolol succinate 25 MG 24 hr tablet Commonly known as: TOPROL-XL Take 25 mg by mouth daily.   Oxycodone HCl 10 MG Tabs Take 1 tablet (10 mg total) by mouth every 6 (six) hours as needed for severe pain ((score 7 to 10)).   pantoprazole 20 MG tablet Commonly known as: PROTONIX Take 20 mg by mouth daily.  PROBIOTIC PO Take 1 capsule by mouth daily.   sertraline 100 MG tablet Commonly known as: ZOLOFT Take 50 mg by mouth daily.   zolpidem 10 MG tablet Commonly known as: AMBIEN Take 10 mg by mouth at bedtime.       Follow-up Information    Donalee Citrin, MD Follow up.   Specialty: Neurosurgery Contact information: [address] [place] [address] [phone]               Signed: Jackelyn Hoehn 10/21/2020, 9:23 AM

## 2020-10-21 NOTE — Discharge Instructions (Signed)
Wound Care  Keep the incision clean and dry remove the outer dressing in 2 days, leave the Steri-Strips intact.  Do not put any creams, lotions, or ointments on incision. Leave steri-strips on neck.  They will fall off by themselves.  Activity Walk each and every day, increasing distance each day. No lifting greater than 5 lbs.  Avoid excessive neck motion. No lifting no bending no twisting no driving or riding a car unless coming back and forth to see me. Wear neck brace at all times except when showering.   Diet Resume your normal diet.   Return to Work Will be discussed at you follow up appointment.  Call Your Doctor If Any of These Occur Redness, drainage, or swelling at the wound.  Temperature greater than 101 degrees. Severe pain not relieved by pain medication. Incision starts to come apart.  Follow Up Appt Call today for appointment in 1-2 weeks ([phone]) or for problems.  If you have any hardware placed in your spine, you will need an x-ray before your appointment.   

## 2020-10-23 ENCOUNTER — Encounter (HOSPITAL_COMMUNITY): Payer: Self-pay | Admitting: Neurosurgery

## 2020-11-29 ENCOUNTER — Other Ambulatory Visit: Payer: Self-pay | Admitting: Neurosurgery

## 2020-11-29 DIAGNOSIS — M542 Cervicalgia: Secondary | ICD-10-CM

## 2020-12-09 ENCOUNTER — Ambulatory Visit
Admission: RE | Admit: 2020-12-09 | Discharge: 2020-12-09 | Disposition: A | Discharge status: Home / Self Care | Payer: Medicare Other | Source: Ambulatory Visit | Attending: Neurosurgery | Admitting: Neurosurgery

## 2020-12-09 DIAGNOSIS — M542 Cervicalgia: Secondary | ICD-10-CM

## 2020-12-15 ENCOUNTER — Other Ambulatory Visit: Payer: Self-pay | Admitting: Neurosurgery

## 2020-12-15 DIAGNOSIS — M542 Cervicalgia: Secondary | ICD-10-CM

## 2020-12-25 ENCOUNTER — Other Ambulatory Visit: Payer: Self-pay | Admitting: Neurosurgery

## 2020-12-25 DIAGNOSIS — M542 Cervicalgia: Secondary | ICD-10-CM

## 2020-12-30 ENCOUNTER — Other Ambulatory Visit: Payer: Self-pay

## 2020-12-30 ENCOUNTER — Ambulatory Visit
Admission: RE | Admit: 2020-12-30 | Discharge: 2020-12-30 | Disposition: A | Discharge status: Home / Self Care | Payer: Medicare Other | Source: Ambulatory Visit | Attending: Neurosurgery | Admitting: Neurosurgery

## 2020-12-30 DIAGNOSIS — M542 Cervicalgia: Secondary | ICD-10-CM

## 2021-01-09 ENCOUNTER — Other Ambulatory Visit: Payer: Self-pay | Admitting: Neurosurgery

## 2021-01-19 ENCOUNTER — Other Ambulatory Visit: Payer: Self-pay | Admitting: Neurosurgery

## 2021-01-26 NOTE — Pre-Procedure Instructions (Signed)
Surgical Instructions    Your procedure is scheduled on Wednesday, June 14th.  Report to [facility] Main Entrance "A" at 9:00 A.M., then check in with the Admitting office.  Call this number if you have problems the morning of surgery:  [phone]   If you have any questions prior to your surgery date call [phone]: Open Monday-Friday 8am-4pm    Remember:  Do not eat or drink after midnight the night before your surgery    Take these medicines the morning of surgery with A SIP OF WATER  atorvastatin (LIPITOR) buPROPion (WELLBUTRIN XL) metoprolol succinate (TOPROL-XL) pantoprazole (PROTONIX)  sertraline (ZOLOFT) traMADol (ULTRAM)    Take these as needed: acetaminophen (TYLENOL)  cyclobenzaprine (FLEXERIL)   As of today, STOP taking any Aspirin (unless otherwise instructed by your surgeon), meloxicam (MOBIC), Aleve, Naproxen, Ibuprofen, Motrin, Advil, Goody's, BC's, all herbal medications, fish oil, and all vitamins.                     Do NOT Smoke (Tobacco/Vaping) or drink Alcohol 24 hours prior to your procedure.  If you use a CPAP at night, you may bring all equipment for your overnight stay.   Contacts, glasses, piercing's, hearing aid's, dentures or partials may not be worn into surgery, please bring cases for these belongings.    For patients admitted to the hospital, discharge time will be determined by your treatment team.   Patients discharged the day of surgery will not be allowed to drive home, and someone needs to stay with them for 24 hours.    Special instructions:   [place]- Preparing For Surgery  Before surgery, you can play an important role. Because skin is not sterile, your skin needs to be as free of germs as possible. You can reduce the number of germs on your skin by washing with CHG (chlorahexidine gluconate) Soap before surgery.  CHG is an antiseptic cleaner which kills germs and bonds with the skin to continue killing germs even after  washing.    Oral Hygiene is also important to reduce your risk of infection.  Remember - BRUSH YOUR TEETH THE MORNING OF SURGERY WITH YOUR REGULAR TOOTHPASTE  Please do not use if you have an allergy to CHG or antibacterial soaps. If your skin becomes reddened/irritated stop using the CHG.  Do not shave (including legs and underarms) for at least 48 hours prior to first CHG shower. It is OK to shave your face.  Please follow these instructions carefully.   Shower the NIGHT BEFORE SURGERY and the MORNING OF SURGERY  If you chose to wash your hair, wash your hair first as usual with your normal shampoo.  After you shampoo, rinse your hair and body thoroughly to remove the shampoo.  Use CHG Soap as you would any other liquid soap. You can apply CHG directly to the skin and wash gently with a scrungie or a clean washcloth.   Apply the CHG Soap to your body ONLY FROM THE NECK DOWN.  Do not use on open wounds or open sores. Avoid contact with your eyes, ears, mouth and genitals (private parts). Wash Face and genitals (private parts)  with your normal soap.   Wash thoroughly, paying special attention to the area where your surgery will be performed.  Thoroughly rinse your body with warm water from the neck down.  DO NOT shower/wash with your normal soap after using and rinsing off the CHG Soap.  Pat yourself dry with a CLEAN  TOWEL.  Wear CLEAN PAJAMAS to bed the night before surgery  Place CLEAN SHEETS on your bed the night before your surgery  DO NOT SLEEP WITH PETS.   Day of Surgery: Shower with CHG soap. Do not wear jewelry, make up, or nail polish Do not wear lotions, powders, perfumes, or deodorant. Do not shave 48 hours prior to surgery.  Do not bring valuables to the hospital. [facility] is not responsible for any belongings or valuables. Wear Clean/Comfortable clothing the morning of surgery Remember to brush your teeth WITH YOUR REGULAR TOOTHPASTE.   Please read over  the following fact sheets that you were given.

## 2021-01-29 ENCOUNTER — Other Ambulatory Visit: Payer: Self-pay

## 2021-01-29 ENCOUNTER — Encounter (HOSPITAL_COMMUNITY): Payer: Self-pay

## 2021-01-29 ENCOUNTER — Encounter (HOSPITAL_COMMUNITY)
Admission: RE | Admit: 2021-01-29 | Discharge: 2021-01-29 | Disposition: A | Discharge status: Home / Self Care | Payer: Medicare Other | Source: Ambulatory Visit | Attending: Neurosurgery | Admitting: Neurosurgery

## 2021-01-29 DIAGNOSIS — K219 Gastro-esophageal reflux disease without esophagitis: Secondary | ICD-10-CM | POA: Insufficient documentation

## 2021-01-29 DIAGNOSIS — Z79899 Other long term (current) drug therapy: Secondary | ICD-10-CM | POA: Insufficient documentation

## 2021-01-29 DIAGNOSIS — Z79891 Long term (current) use of opiate analgesic: Secondary | ICD-10-CM | POA: Insufficient documentation

## 2021-01-29 DIAGNOSIS — Z791 Long term (current) use of non-steroidal anti-inflammatories (NSAID): Secondary | ICD-10-CM | POA: Insufficient documentation

## 2021-01-29 DIAGNOSIS — Z01812 Encounter for preprocedural laboratory examination: Secondary | ICD-10-CM | POA: Insufficient documentation

## 2021-01-29 DIAGNOSIS — I10 Essential (primary) hypertension: Secondary | ICD-10-CM | POA: Insufficient documentation

## 2021-01-29 DIAGNOSIS — M542 Cervicalgia: Secondary | ICD-10-CM | POA: Insufficient documentation

## 2021-01-29 DIAGNOSIS — I2510 Atherosclerotic heart disease of native coronary artery without angina pectoris: Secondary | ICD-10-CM | POA: Insufficient documentation

## 2021-01-29 DIAGNOSIS — Z87891 Personal history of nicotine dependence: Secondary | ICD-10-CM | POA: Insufficient documentation

## 2021-01-29 DIAGNOSIS — E785 Hyperlipidemia, unspecified: Secondary | ICD-10-CM | POA: Insufficient documentation

## 2021-01-29 DIAGNOSIS — Z20822 Contact with and (suspected) exposure to covid-19: Secondary | ICD-10-CM | POA: Insufficient documentation

## 2021-01-29 DIAGNOSIS — R9431 Abnormal electrocardiogram [ECG] [EKG]: Secondary | ICD-10-CM | POA: Insufficient documentation

## 2021-01-29 HISTORY — DX: Headache, unspecified: R51.9

## 2021-01-29 HISTORY — DX: Pneumonia, unspecified organism: J18.9

## 2021-01-29 LAB — CBC
HCT: 40.9 % (ref 36.0–46.0)
Hemoglobin: 13.3 g/dL (ref 12.0–15.0)
MCH: 28.9 pg (ref 26.0–34.0)
MCHC: 32.5 g/dL (ref 30.0–36.0)
MCV: 88.9 fL (ref 80.0–100.0)
Platelets: 233 10*3/uL (ref 150–400)
RBC: 4.6 MIL/uL (ref 3.87–5.11)
RDW: 12.8 % (ref 11.5–15.5)
WBC: 7 10*3/uL (ref 4.0–10.5)
nRBC: 0 % (ref 0.0–0.2)

## 2021-01-29 LAB — SURGICAL PCR SCREEN
MRSA, PCR: NEGATIVE
Staphylococcus aureus: POSITIVE — AB

## 2021-01-29 LAB — BASIC METABOLIC PANEL
Anion gap: 10 (ref 5–15)
BUN: 11 mg/dL (ref 8–23)
CO2: 28 mmol/L (ref 22–32)
Calcium: 10.1 mg/dL (ref 8.9–10.3)
Chloride: 99 mmol/L (ref 98–111)
Creatinine, Ser: 1.09 mg/dL — ABNORMAL HIGH (ref 0.44–1.00)
GFR, Estimated: 54 mL/min — ABNORMAL LOW (ref >60)
Glucose, Bld: 108 mg/dL — ABNORMAL HIGH (ref 70–99)
Potassium: 4.5 mmol/L (ref 3.5–5.1)
Sodium: 137 mmol/L (ref 135–145)

## 2021-01-29 LAB — SARS CORONAVIRUS 2 (TAT 6-24 HRS): SARS Coronavirus 2: NEGATIVE

## 2021-01-29 LAB — TYPE AND SCREEN
ABO/RH(D): A POS
Antibody Screen: NEGATIVE

## 2021-01-29 NOTE — Progress Notes (Signed)
PCP - Micael Hampshire, NP w/ Dr. Darnelle Maffucci office Cardiologist - Per Pt, she has not seen one in more than > years, but formerly Dr. Merrily Pew  PPM/ICD - Denies  Chest x-ray - N/A EKG - 10/20/20 Stress Test - Per pt, probably ~ 2016 w/ Dr. Bary Castilla @ [facility]; Records requested ECHO - Denies Cardiac Cath - Per pt, done @ [facility]; Records requested  Sleep Study - Denies  Pt denies being diabetic.  Blood Thinner Instructions: N/A Aspirin Instructions: N/A  ERAS Protcol - N/A PRE-SURGERY Ensure or G2- N/A  COVID TEST- 01/29/21   Anesthesia review: Yes, cardiac hx.  Patient denies shortness of breath, fever, cough and chest pain at PAT appointment   All instructions explained to the patient, with a verbal understanding of the material. Patient agrees to go over the instructions while at home for a better understanding. Patient also instructed to self quarantine after being tested for COVID-19. The opportunity to ask questions was provided.

## 2021-01-30 NOTE — Progress Notes (Addendum)
Anesthesia Chart Review:  Case: [id number] Date/Time: 01/31/21 0815   Procedures:      Posterior cervical fusion with lateral mass fixation - C7-T1 with right-sided foraminotomy of the C8 nerve root and with O arm     APPLICATION OF ROBOTIC ASSISTANCE FOR SPINAL PROCEDURE   Anesthesia type: General   Pre-op diagnosis: Cervicalgia   [facility] ROOM 20 / [facility] OR   Surgeons: Donalee Citrin, MD       DISCUSSION: Patient is a 70 year old female scheduled for the above procedure. She underwent ACDF on 10/20/20 at [facility].   History includes former smoker (quit 08/20/11), CAD (per 12/13/15 [facility] cardiology note, MI s/p PCI x1 1999 in [place], [place] with multiple subsequent normal stress echocardiograms), HTN, hypercholesterolemia, GERD, migraines, urinary incontinence (s/p sling operation 01/12/15), left foot surgery (ORIF left tarsometatarsal dislocation 09/13/16, removal of pins/screw 12/19/16), neck surgery (s/p exploration fusion removal of the inferior aspect of the plate below C5 screws and ACDF C6-7 10/20/20).   Unable to obtain additional information related to reported 1999 PCI (no additional records readily available per record request [facility] in [place] or from [facility] which was previously associated with [facility] in 2017/2018 but is now a part of [facility] 2017-2018 cardiology notes indicate she had subsequent normal stress echocardiograms (patient thinks last stress test ~ 2016). More recently, CAD has been managed by her PCP. She is on statin, ACEi, and blocker. No chest pain, fever, cough, SOB per PAT RN interview. She tolerated recent cervical spine surgery about three months ago.   01/29/2021 presurgical COVID-19 test negative. Anesthesia team to evaluate on the day of surgery.    VS: BP 125/84   Pulse 84   Temp 36.9 C (Oral)   Resp 18   Ht 5\' 3"  (1.6 m)   Wt 80.3 kg   SpO2 98%   BMI 31.35 kg/m    PROVIDERS: , MD is PCP, primarily sees Mattie Marlin,  NP [facility], see Care Everywhere). Sandre Kitty, MD is rheumatologist. Initial consult 04/20/19 for + ANA, dry mouth, Sjogren's syndrome?.  - Last cardiology visit seen 12/18/16 with 02/17/17, MD (when he was with [facility] which later became a part of [facility]), and more recently CAD has been followed by PCP.    LABS: Labs reviewed: Acceptable for surgery. (all labs ordered are listed, but only abnormal results are displayed)  Labs Reviewed  SURGICAL PCR SCREEN - Abnormal; Notable for the following components:      Result Value   Staphylococcus aureus POSITIVE (*)    All other components within normal limits  BASIC METABOLIC PANEL - Abnormal; Notable for the following components:   Glucose, Bld 108 (*)    Creatinine, Ser 1.09 (*)    GFR, Estimated 54 (*)    All other components within normal limits  SARS CORONAVIRUS 2 (TAT 6-24 HRS)  CBC  TYPE AND SCREEN     IMAGES: CT C-spine 12/30/20: IMPRESSION: - ACDF with solid interbody fusion C4-5 and C5-6. Mild foraminal narrowing bilaterally due to spurring - ACDF C6-7 with 4 mm anterolisthesis unchanged. Moderate foraminal narrowing bilaterally due to spurring. No interval change. - Mild foraminal narrowing bilaterally C7-T1 due to spurring.  CT Lung cancer screen 02/09/20 [facility]): IMPRESSION:  1. Lung-RADS 2, benign appearance or behavior. Continue annual  screening with low-dose chest CT without contrast in 12 months.  2. Hepatic steatosis.  3. Aortic Atherosclerosis (  ICD10-I70.0) and Emphysema (ICD10-J43.9).  4.  Coronary artery atherosclerosis.    EKG: 10/20/20: Normal sinus rhythm Left anterior fasicular block Increased R/S ratio in V1, consider early transition or posterior infarct Abnormal ECG No previous tracing Confirmed by Chilton Si ([id number]) on 10/22/2020 5:52:22 PM   CV: Remote history of cardiac cath ~ 1999. She thinks her last stress test was in 2016. Denied echocardiogram.  2017 [facility] cardiology notes mention previous stress echocardiograms.    Past Medical History:  Diagnosis Date   Arthritis    Coronary artery disease    Depression    GERD (gastroesophageal reflux disease)    Headache    migraines   High cholesterol    Hypertension    Myocardial infarction (HCC)    Pneumonia     Past Surgical History:  Procedure Laterality Date   ABDOMINAL HYSTERECTOMY     ANTERIOR CERVICAL DECOMP/DISCECTOMY FUSION N/A 10/20/2020   Procedure: ANTERIOR CERVICAL DECOMPRESSION FUSION  - CERVICAL SIX-CERVICAL SEVEN WITH EXPLORATION REMOVAL HARDWARE  CERVICAL FOUR-CERVICAL SIX;  Surgeon: Donalee Citrin, MD;  Location: [facility] OR;  Service: Neurosurgery;  Laterality: N/A;   APPENDECTOMY     CARDIAC CATHETERIZATION     stent placed at age 77   CERVICAL SPINE SURGERY     CHOLECYSTECTOMY     DIAGNOSTIC LAPAROSCOPY     lap chole   EYE SURGERY Left    laser eye sx.   FOOT FRACTURE SURGERY Left    REFRACTIVE SURGERY     TONSILLECTOMY      MEDICATIONS:  acetaminophen (TYLENOL) 325 MG tablet   atorvastatin (LIPITOR) 20 MG tablet   buPROPion (WELLBUTRIN XL) 150 MG 24 hr tablet   cyclobenzaprine (FLEXERIL) 10 MG tablet   ibuprofen (ADVIL) 200 MG tablet   lisinopril (ZESTRIL) 10 MG tablet   meloxicam (MOBIC) 15 MG tablet   metoprolol succinate (TOPROL-XL) 25 MG 24 hr tablet   naproxen sodium (ALEVE) 220 MG tablet   pantoprazole (PROTONIX) 20 MG tablet   Probiotic Product (PROBIOTIC PO)   sertraline (ZOLOFT) 100 MG tablet   traMADol (ULTRAM) 50 MG tablet   zolpidem (AMBIEN) 10 MG tablet   No current facility-administered medications for this encounter.    Shonna Chock, PA-C Surgical Short Stay/Anesthesiology [facility] Phone [phone] [facility] Phone [phone] 01/30/2021 11:52 AM

## 2021-01-30 NOTE — Anesthesia Preprocedure Evaluation (Addendum)
Anesthesia Evaluation  Patient identified by MRN, date of birth, ID band Patient awake    Reviewed: Allergy & Precautions, NPO status , Patient's Chart, lab work & pertinent test results, reviewed documented beta blocker date and time   History of Anesthesia Complications Negative for: history of anesthetic complications  Airway Mallampati: II  TM Distance: >3 FB Neck ROM: Limited    Dental  (+) Dental Advisory Given, Caps   Pulmonary former smoker,  01/29/2021 SARS coronavirus NEG   breath sounds clear to auscultation       Cardiovascular hypertension, Pt. on medications and Pt. on home beta blockers + CAD, + Past MI and + Cardiac Stents   Rhythm:Regular Rate:Normal     Neuro/Psych  Headaches, Depression    GI/Hepatic Neg liver ROS, GERD  Medicated and Controlled,  Endo/Other  obese  Renal/GU negative Renal ROS     Musculoskeletal  (+) Arthritis ,   Abdominal (+) + obese,   Peds  Hematology negative hematology ROS (+)   Anesthesia Other Findings   Reproductive/Obstetrics                           Anesthesia Physical Anesthesia Plan  ASA: 3  Anesthesia Plan: General   Post-op Pain Management:    Induction: Intravenous  PONV Risk Score and Plan: 2 and Dexamethasone and Ondansetron  Airway Management Planned: Oral ETT and Video Laryngoscope Planned  Additional Equipment: None  Intra-op Plan:   Post-operative Plan: Extubation in OR  Informed Consent: I have reviewed the patients History and Physical, chart, labs and discussed the procedure including the risks, benefits and alternatives for the proposed anesthesia with the patient or authorized representative who has indicated his/her understanding and acceptance.     Dental advisory given  Plan Discussed with: CRNA and Surgeon  Anesthesia Plan Comments: (PAT note written 01/30/2021 by Shonna Chock, PA-C. )       Anesthesia Quick Evaluation

## 2021-01-31 ENCOUNTER — Other Ambulatory Visit: Payer: Self-pay

## 2021-01-31 ENCOUNTER — Inpatient Hospital Stay (HOSPITAL_COMMUNITY)
Admission: RE | Disposition: A | Discharge status: Home / Self Care | Payer: Self-pay | Source: Home / Self Care | Attending: Neurosurgery

## 2021-01-31 ENCOUNTER — Inpatient Hospital Stay (HOSPITAL_COMMUNITY): Payer: Medicare Other

## 2021-01-31 ENCOUNTER — Inpatient Hospital Stay (HOSPITAL_COMMUNITY)
Admission: RE | Admit: 2021-01-31 | Discharge: 2021-02-01 | DRG: 472 | Disposition: A | Discharge status: Home / Self Care | Payer: Medicare Other | Attending: Neurosurgery | Admitting: Neurosurgery

## 2021-01-31 ENCOUNTER — Inpatient Hospital Stay (HOSPITAL_COMMUNITY): Payer: Medicare Other | Admitting: Vascular Surgery

## 2021-01-31 ENCOUNTER — Encounter (HOSPITAL_COMMUNITY): Payer: Self-pay | Admitting: Neurosurgery

## 2021-01-31 ENCOUNTER — Inpatient Hospital Stay (HOSPITAL_COMMUNITY): Payer: Medicare Other | Admitting: Anesthesiology

## 2021-01-31 DIAGNOSIS — Z01812 Encounter for preprocedural laboratory examination: Secondary | ICD-10-CM | POA: Diagnosis not present

## 2021-01-31 DIAGNOSIS — Z419 Encounter for procedure for purposes other than remedying health state, unspecified: Secondary | ICD-10-CM

## 2021-01-31 DIAGNOSIS — Z20822 Contact with and (suspected) exposure to covid-19: Secondary | ICD-10-CM | POA: Diagnosis present

## 2021-01-31 DIAGNOSIS — S129XXA Fracture of neck, unspecified, initial encounter: Secondary | ICD-10-CM | POA: Diagnosis present

## 2021-01-31 DIAGNOSIS — M960 Pseudarthrosis after fusion or arthrodesis: Secondary | ICD-10-CM | POA: Diagnosis present

## 2021-01-31 DIAGNOSIS — I10 Essential (primary) hypertension: Secondary | ICD-10-CM | POA: Diagnosis present

## 2021-01-31 DIAGNOSIS — Z791 Long term (current) use of non-steroidal anti-inflammatories (NSAID): Secondary | ICD-10-CM | POA: Diagnosis not present

## 2021-01-31 DIAGNOSIS — I2510 Atherosclerotic heart disease of native coronary artery without angina pectoris: Secondary | ICD-10-CM | POA: Diagnosis present

## 2021-01-31 DIAGNOSIS — Y752 Prosthetic and other implants, materials and neurological devices associated with adverse incidents: Secondary | ICD-10-CM | POA: Diagnosis present

## 2021-01-31 DIAGNOSIS — T84226A Displacement of internal fixation device of vertebrae, initial encounter: Secondary | ICD-10-CM | POA: Diagnosis present

## 2021-01-31 DIAGNOSIS — Z79899 Other long term (current) drug therapy: Secondary | ICD-10-CM

## 2021-01-31 DIAGNOSIS — Z90710 Acquired absence of both cervix and uterus: Secondary | ICD-10-CM

## 2021-01-31 DIAGNOSIS — M5412 Radiculopathy, cervical region: Secondary | ICD-10-CM | POA: Diagnosis present

## 2021-01-31 DIAGNOSIS — K219 Gastro-esophageal reflux disease without esophagitis: Secondary | ICD-10-CM | POA: Diagnosis present

## 2021-01-31 DIAGNOSIS — E7800 Pure hypercholesterolemia, unspecified: Secondary | ICD-10-CM | POA: Diagnosis present

## 2021-01-31 DIAGNOSIS — R9431 Abnormal electrocardiogram [ECG] [EKG]: Secondary | ICD-10-CM | POA: Diagnosis present

## 2021-01-31 DIAGNOSIS — I252 Old myocardial infarction: Secondary | ICD-10-CM | POA: Diagnosis not present

## 2021-01-31 DIAGNOSIS — Z87891 Personal history of nicotine dependence: Secondary | ICD-10-CM

## 2021-01-31 DIAGNOSIS — Z9049 Acquired absence of other specified parts of digestive tract: Secondary | ICD-10-CM | POA: Diagnosis not present

## 2021-01-31 DIAGNOSIS — M4802 Spinal stenosis, cervical region: Secondary | ICD-10-CM | POA: Diagnosis present

## 2021-01-31 HISTORY — PX: POSTERIOR CERVICAL FUSION/FORAMINOTOMY: SHX5038

## 2021-01-31 HISTORY — PX: APPLICATION OF ROBOTIC ASSISTANCE FOR SPINAL PROCEDURE: SHX6753

## 2021-01-31 LAB — ABO/RH: ABO/RH(D): A POS

## 2021-01-31 SURGERY — POSTERIOR CERVICAL FUSION/FORAMINOTOMY LEVEL 1
Anesthesia: General | Site: Spine Cervical

## 2021-01-31 MED ORDER — LACTATED RINGERS IV SOLN: INTRAVENOUS | Status: DC

## 2021-01-31 MED ORDER — HYDROMORPHONE HCL 1 MG/ML IJ SOLN
INTRAMUSCULAR | Status: AC
  Administered 2021-01-31: 0.25 mg via INTRAVENOUS
  Filled 2021-01-31: qty 1

## 2021-01-31 MED ORDER — VASOPRESSIN 20 UNIT/ML IV SOLN
INTRAVENOUS | Status: AC
  Filled 2021-01-31: qty 1

## 2021-01-31 MED ORDER — EPHEDRINE 5 MG/ML INJ
INTRAVENOUS | Status: AC
  Filled 2021-01-31: qty 10

## 2021-01-31 MED ORDER — DEXAMETHASONE SODIUM PHOSPHATE 10 MG/ML IJ SOLN
INTRAMUSCULAR | Status: AC
  Filled 2021-01-31: qty 1

## 2021-01-31 MED ORDER — LIDOCAINE-EPINEPHRINE 1 %-1:100000 IJ SOLN
INTRAMUSCULAR | Status: AC
  Filled 2021-01-31: qty 1

## 2021-01-31 MED ORDER — THROMBIN 20000 UNITS EX SOLR: CUTANEOUS | Status: DC | PRN

## 2021-01-31 MED ORDER — ALBUMIN HUMAN 5 % IV SOLN: INTRAVENOUS | Status: DC | PRN

## 2021-01-31 MED ORDER — THROMBIN 20000 UNITS EX KIT
PACK | CUTANEOUS | Status: AC
  Filled 2021-01-31: qty 1

## 2021-01-31 MED ORDER — NAPROXEN SODIUM 220 MG PO TABS: 660.0000 mg | ORAL_TABLET | Freq: Every day | ORAL | Status: DC | PRN

## 2021-01-31 MED ORDER — MIDAZOLAM HCL 2 MG/2ML IJ SOLN: 0.5000 mg | Freq: Once | INTRAMUSCULAR | Status: DC | PRN

## 2021-01-31 MED ORDER — LISINOPRIL 10 MG PO TABS
10.0000 mg | ORAL_TABLET | Freq: Every day | ORAL | Status: DC
  Administered 2021-02-01: 10 mg via ORAL
  Filled 2021-01-31: qty 1

## 2021-01-31 MED ORDER — SODIUM CHLORIDE 0.9% FLUSH: 3.0000 mL | INTRAVENOUS | Status: DC | PRN

## 2021-01-31 MED ORDER — OXYCODONE HCL 5 MG/5ML PO SOLN
5.0000 mg | Freq: Once | ORAL | Status: AC | PRN
Start: 2021-01-31 — End: 2021-01-31

## 2021-01-31 MED ORDER — LACTATED RINGERS IV SOLN: INTRAVENOUS | Status: DC | PRN

## 2021-01-31 MED ORDER — PROMETHAZINE HCL 25 MG/ML IJ SOLN: 6.2500 mg | INTRAMUSCULAR | Status: DC | PRN

## 2021-01-31 MED ORDER — THROMBIN 5000 UNITS EX SOLR
CUTANEOUS | Status: AC
  Filled 2021-01-31: qty 5000

## 2021-01-31 MED ORDER — ACETAMINOPHEN 500 MG PO TABS
1000.0000 mg | ORAL_TABLET | Freq: Once | ORAL | Status: DC
  Filled 2021-01-31: qty 2

## 2021-01-31 MED ORDER — CHLORHEXIDINE GLUCONATE 0.12 % MT SOLN
15.0000 mL | Freq: Once | OROMUCOSAL | Status: AC
  Administered 2021-01-31: 15 mL via OROMUCOSAL
  Filled 2021-01-31: qty 15

## 2021-01-31 MED ORDER — HYDROMORPHONE HCL 1 MG/ML IJ SOLN
0.2500 mg | INTRAMUSCULAR | Status: DC | PRN
  Administered 2021-01-31: 0.25 mg via INTRAVENOUS
  Administered 2021-01-31: 0.5 mg via INTRAVENOUS
  Administered 2021-01-31: 0.25 mg via INTRAVENOUS
  Administered 2021-01-31: 0.5 mg via INTRAVENOUS

## 2021-01-31 MED ORDER — PHENOL 1.4 % MT LIQD: 1.0000 | OROMUCOSAL | Status: DC | PRN

## 2021-01-31 MED ORDER — SERTRALINE HCL 50 MG PO TABS
100.0000 mg | ORAL_TABLET | Freq: Every day | ORAL | Status: DC
  Administered 2021-02-01: 100 mg via ORAL
  Filled 2021-01-31: qty 2

## 2021-01-31 MED ORDER — PROPOFOL 10 MG/ML IV BOLUS
INTRAVENOUS | Status: AC
  Filled 2021-01-31: qty 20

## 2021-01-31 MED ORDER — OXYCODONE HCL 5 MG PO TABS: 5.0000 mg | ORAL_TABLET | Freq: Once | ORAL | Status: AC | PRN

## 2021-01-31 MED ORDER — OXYCODONE HCL 5 MG PO TABS
10.0000 mg | ORAL_TABLET | ORAL | Status: DC | PRN
  Administered 2021-01-31 – 2021-02-01 (×5): 10 mg via ORAL
  Filled 2021-01-31 (×5): qty 2

## 2021-01-31 MED ORDER — PHENYLEPHRINE 40 MCG/ML (10ML) SYRINGE FOR IV PUSH (FOR BLOOD PRESSURE SUPPORT)
PREFILLED_SYRINGE | INTRAVENOUS | Status: AC
  Filled 2021-01-31: qty 10

## 2021-01-31 MED ORDER — ACETAMINOPHEN 325 MG PO TABS: 650.0000 mg | ORAL_TABLET | ORAL | Status: DC | PRN

## 2021-01-31 MED ORDER — CHLORHEXIDINE GLUCONATE CLOTH 2 % EX PADS: 6.0000 | MEDICATED_PAD | Freq: Once | CUTANEOUS | Status: DC

## 2021-01-31 MED ORDER — 0.9 % SODIUM CHLORIDE (POUR BTL) OPTIME
TOPICAL | Status: DC | PRN
  Administered 2021-01-31: 1000 mL

## 2021-01-31 MED ORDER — LIDOCAINE-EPINEPHRINE 1 %-1:100000 IJ SOLN
INTRAMUSCULAR | Status: DC | PRN
  Administered 2021-01-31: 8 mL

## 2021-01-31 MED ORDER — CYCLOBENZAPRINE HCL 10 MG PO TABS
ORAL_TABLET | ORAL | Status: AC
  Filled 2021-01-31: qty 1

## 2021-01-31 MED ORDER — PANTOPRAZOLE SODIUM 20 MG PO TBEC
20.0000 mg | DELAYED_RELEASE_TABLET | Freq: Every day | ORAL | Status: DC
  Administered 2021-02-01: 20 mg via ORAL
  Filled 2021-01-31: qty 1

## 2021-01-31 MED ORDER — SODIUM CHLORIDE 0.9% FLUSH
3.0000 mL | Freq: Two times a day (BID) | INTRAVENOUS | Status: DC
  Administered 2021-01-31: 3 mL via INTRAVENOUS

## 2021-01-31 MED ORDER — MENTHOL 3 MG MT LOZG: 1.0000 | LOZENGE | OROMUCOSAL | Status: DC | PRN

## 2021-01-31 MED ORDER — LIDOCAINE 2% (20 MG/ML) 5 ML SYRINGE
INTRAMUSCULAR | Status: DC | PRN
  Administered 2021-01-31: 20 mg via INTRAVENOUS

## 2021-01-31 MED ORDER — DEXAMETHASONE SODIUM PHOSPHATE 10 MG/ML IJ SOLN
10.0000 mg | Freq: Once | INTRAMUSCULAR | Status: DC
  Filled 2021-01-31: qty 1

## 2021-01-31 MED ORDER — MIDAZOLAM HCL 2 MG/2ML IJ SOLN
INTRAMUSCULAR | Status: DC | PRN
  Administered 2021-01-31: 2 mg via INTRAVENOUS

## 2021-01-31 MED ORDER — MIDAZOLAM HCL 2 MG/2ML IJ SOLN
INTRAMUSCULAR | Status: AC
  Filled 2021-01-31: qty 2

## 2021-01-31 MED ORDER — ONDANSETRON HCL 4 MG/2ML IJ SOLN: 4.0000 mg | Freq: Four times a day (QID) | INTRAMUSCULAR | Status: DC | PRN

## 2021-01-31 MED ORDER — ROCURONIUM BROMIDE 10 MG/ML (PF) SYRINGE
PREFILLED_SYRINGE | INTRAVENOUS | Status: DC | PRN
  Administered 2021-01-31: 40 mg via INTRAVENOUS
  Administered 2021-01-31: 20 mg via INTRAVENOUS
  Administered 2021-01-31: 30 mg via INTRAVENOUS
  Administered 2021-01-31: 70 mg via INTRAVENOUS

## 2021-01-31 MED ORDER — OXYCODONE HCL 5 MG PO TABS
ORAL_TABLET | ORAL | Status: AC
  Administered 2021-01-31: 5 mg via ORAL
  Filled 2021-01-31: qty 1

## 2021-01-31 MED ORDER — ORAL CARE MOUTH RINSE: 15.0000 mL | Freq: Once | OROMUCOSAL | Status: AC

## 2021-01-31 MED ORDER — BACITRACIN ZINC 500 UNIT/GM EX OINT
TOPICAL_OINTMENT | CUTANEOUS | Status: DC | PRN
  Administered 2021-01-31: 1 via TOPICAL

## 2021-01-31 MED ORDER — ATORVASTATIN CALCIUM 10 MG PO TABS
20.0000 mg | ORAL_TABLET | Freq: Every day | ORAL | Status: DC
  Administered 2021-02-01: 20 mg via ORAL
  Filled 2021-01-31: qty 2

## 2021-01-31 MED ORDER — ONDANSETRON HCL 4 MG PO TABS: 4.0000 mg | ORAL_TABLET | Freq: Four times a day (QID) | ORAL | Status: DC | PRN

## 2021-01-31 MED ORDER — ONDANSETRON HCL 4 MG/2ML IJ SOLN
INTRAMUSCULAR | Status: AC
  Filled 2021-01-31: qty 2

## 2021-01-31 MED ORDER — BUPROPION HCL ER (XL) 150 MG PO TB24
150.0000 mg | ORAL_TABLET | Freq: Every day | ORAL | Status: DC
  Administered 2021-02-01: 150 mg via ORAL
  Filled 2021-01-31: qty 1

## 2021-01-31 MED ORDER — EPHEDRINE SULFATE-NACL 50-0.9 MG/10ML-% IV SOSY
PREFILLED_SYRINGE | INTRAVENOUS | Status: DC | PRN
  Administered 2021-01-31: 10 mg via INTRAVENOUS
  Administered 2021-01-31 (×4): 5 mg via INTRAVENOUS
  Administered 2021-01-31 (×3): 10 mg via INTRAVENOUS

## 2021-01-31 MED ORDER — ALUM & MAG HYDROXIDE-SIMETH 200-200-20 MG/5ML PO SUSP: 30.0000 mL | Freq: Four times a day (QID) | ORAL | Status: DC | PRN

## 2021-01-31 MED ORDER — ZOLPIDEM TARTRATE 5 MG PO TABS
5.0000 mg | ORAL_TABLET | Freq: Every day | ORAL | Status: DC
  Administered 2021-01-31: 5 mg via ORAL
  Filled 2021-01-31: qty 1

## 2021-01-31 MED ORDER — HYDROMORPHONE HCL 1 MG/ML IJ SOLN
0.5000 mg | INTRAMUSCULAR | Status: DC | PRN
  Administered 2021-01-31: 0.5 mg via INTRAVENOUS
  Filled 2021-01-31 (×2): qty 0.5

## 2021-01-31 MED ORDER — PROBIOTIC PO TBEC: DELAYED_RELEASE_TABLET | Freq: Every day | ORAL | Status: DC

## 2021-01-31 MED ORDER — ACETAMINOPHEN 650 MG RE SUPP: 650.0000 mg | RECTAL | Status: DC | PRN

## 2021-01-31 MED ORDER — LIDOCAINE HCL (PF) 2 % IJ SOLN
INTRAMUSCULAR | Status: AC
  Filled 2021-01-31: qty 5

## 2021-01-31 MED ORDER — PANTOPRAZOLE SODIUM 40 MG IV SOLR: 40.0000 mg | Freq: Every day | INTRAVENOUS | Status: DC

## 2021-01-31 MED ORDER — FENTANYL CITRATE (PF) 250 MCG/5ML IJ SOLN
INTRAMUSCULAR | Status: AC
  Filled 2021-01-31: qty 5

## 2021-01-31 MED ORDER — ACETAMINOPHEN 325 MG PO TABS: 650.0000 mg | ORAL_TABLET | Freq: Four times a day (QID) | ORAL | Status: DC | PRN

## 2021-01-31 MED ORDER — CYCLOBENZAPRINE HCL 10 MG PO TABS
10.0000 mg | ORAL_TABLET | Freq: Three times a day (TID) | ORAL | Status: DC | PRN
  Filled 2021-01-31: qty 1

## 2021-01-31 MED ORDER — CEFAZOLIN SODIUM-DEXTROSE 2-4 GM/100ML-% IV SOLN
2.0000 g | INTRAVENOUS | Status: AC
  Administered 2021-01-31: 2 g via INTRAVENOUS
  Filled 2021-01-31: qty 100

## 2021-01-31 MED ORDER — ONDANSETRON HCL 4 MG/2ML IJ SOLN
INTRAMUSCULAR | Status: DC | PRN
  Administered 2021-01-31: 4 mg via INTRAVENOUS

## 2021-01-31 MED ORDER — PROPOFOL 10 MG/ML IV BOLUS
INTRAVENOUS | Status: AC
  Filled 2021-01-31: qty 40

## 2021-01-31 MED ORDER — TRAMADOL HCL 50 MG PO TABS: 50.0000 mg | ORAL_TABLET | Freq: Four times a day (QID) | ORAL | Status: DC | PRN

## 2021-01-31 MED ORDER — DEXAMETHASONE SODIUM PHOSPHATE 10 MG/ML IJ SOLN
INTRAMUSCULAR | Status: DC | PRN
  Administered 2021-01-31: 10 mg via INTRAVENOUS

## 2021-01-31 MED ORDER — DEXAMETHASONE SODIUM PHOSPHATE 10 MG/ML IJ SOLN
10.0000 mg | Freq: Four times a day (QID) | INTRAMUSCULAR | Status: DC
  Administered 2021-01-31 – 2021-02-01 (×3): 10 mg via INTRAVENOUS
  Filled 2021-01-31 (×3): qty 1

## 2021-01-31 MED ORDER — SODIUM CHLORIDE 0.9 % IV SOLN: 250.0000 mL | INTRAVENOUS | Status: DC

## 2021-01-31 MED ORDER — THROMBIN 5000 UNITS EX SOLR: OROMUCOSAL | Status: DC | PRN

## 2021-01-31 MED ORDER — PROPOFOL 10 MG/ML IV BOLUS
INTRAVENOUS | Status: DC | PRN
  Administered 2021-01-31: 150 mg via INTRAVENOUS
  Administered 2021-01-31: 50 mg via INTRAVENOUS

## 2021-01-31 MED ORDER — BUPIVACAINE HCL (PF) 0.25 % IJ SOLN
INTRAMUSCULAR | Status: AC
  Filled 2021-01-31: qty 30

## 2021-01-31 MED ORDER — FENTANYL CITRATE (PF) 250 MCG/5ML IJ SOLN
INTRAMUSCULAR | Status: DC | PRN
  Administered 2021-01-31: 150 ug via INTRAVENOUS
  Administered 2021-01-31: 50 ug via INTRAVENOUS

## 2021-01-31 MED ORDER — ROCURONIUM BROMIDE 10 MG/ML (PF) SYRINGE
PREFILLED_SYRINGE | INTRAVENOUS | Status: AC
  Filled 2021-01-31: qty 20

## 2021-01-31 MED ORDER — PHENYLEPHRINE HCL-NACL 10-0.9 MG/250ML-% IV SOLN
INTRAVENOUS | Status: DC | PRN
  Administered 2021-01-31: 25 ug/min via INTRAVENOUS

## 2021-01-31 MED ORDER — SUGAMMADEX SODIUM 200 MG/2ML IV SOLN
INTRAVENOUS | Status: DC | PRN
  Administered 2021-01-31: 320 mg via INTRAVENOUS

## 2021-01-31 MED ORDER — MEPERIDINE HCL 25 MG/ML IJ SOLN: 6.2500 mg | INTRAMUSCULAR | Status: DC | PRN

## 2021-01-31 MED ORDER — CEFAZOLIN SODIUM-DEXTROSE 2-4 GM/100ML-% IV SOLN
2.0000 g | Freq: Three times a day (TID) | INTRAVENOUS | Status: AC
  Administered 2021-01-31 (×2): 2 g via INTRAVENOUS
  Filled 2021-01-31 (×2): qty 100

## 2021-01-31 MED ORDER — VASOPRESSIN 20 UNIT/ML IV SOLN
INTRAVENOUS | Status: DC | PRN
  Administered 2021-01-31: 1 [IU] via INTRAVENOUS
  Administered 2021-01-31 (×4): 2 [IU] via INTRAVENOUS
  Administered 2021-01-31 (×2): 1 [IU] via INTRAVENOUS
  Administered 2021-01-31: 2 [IU] via INTRAVENOUS

## 2021-01-31 MED ORDER — PHENYLEPHRINE 40 MCG/ML (10ML) SYRINGE FOR IV PUSH (FOR BLOOD PRESSURE SUPPORT)
PREFILLED_SYRINGE | INTRAVENOUS | Status: DC | PRN
  Administered 2021-01-31 (×2): 80 ug via INTRAVENOUS
  Administered 2021-01-31: 120 ug via INTRAVENOUS
  Administered 2021-01-31 (×2): 80 ug via INTRAVENOUS

## 2021-01-31 MED ORDER — CYCLOBENZAPRINE HCL 10 MG PO TABS
10.0000 mg | ORAL_TABLET | Freq: Two times a day (BID) | ORAL | Status: DC | PRN
  Administered 2021-01-31 – 2021-02-01 (×3): 10 mg via ORAL
  Filled 2021-01-31 (×2): qty 1

## 2021-01-31 MED ORDER — METOPROLOL SUCCINATE ER 25 MG PO TB24
25.0000 mg | ORAL_TABLET | Freq: Every day | ORAL | Status: DC
  Administered 2021-02-01: 25 mg via ORAL
  Filled 2021-01-31: qty 1

## 2021-01-31 MED ORDER — BACITRACIN ZINC 500 UNIT/GM EX OINT
TOPICAL_OINTMENT | CUTANEOUS | Status: AC
  Filled 2021-01-31: qty 28.35

## 2021-01-31 SURGICAL SUPPLY — 84 items
BAND RUBBER #18 3X1/16 STRL (MISCELLANEOUS) IMPLANT
BENZOIN TINCTURE PRP APPL 2/3 (GAUZE/BANDAGES/DRESSINGS) ×3 IMPLANT
BIT DRILL 3.5 SHORT (BIT) ×3 IMPLANT
BIT DRILL LONG 3.0X30 (BIT) IMPLANT
BIT DRILL LONG 3X80 (BIT) IMPLANT
BIT DRILL LONG 4X80 (BIT) IMPLANT
BIT DRILL SHORT 3.0X30 (BIT) IMPLANT
BIT DRILL SHORT 3X80 (BIT) IMPLANT
BLADE CLIPPER SURG (BLADE) ×3 IMPLANT
BLADE SURG 11 STRL SS (BLADE) IMPLANT
BONE VIVIGEN FORMABLE 5.4CC (Bone Implant) ×3 IMPLANT
BUR MATCHSTICK NEURO 3.0 LAGG (BURR) ×3 IMPLANT
CANISTER SUCT 3000ML PPV (MISCELLANEOUS) ×3 IMPLANT
CAP LOCKING (Cap) ×8 IMPLANT
CARTRIDGE OIL MAESTRO DRILL (MISCELLANEOUS) ×2 IMPLANT
CLSR STERI-STRIP ANTIMIC 1/2X4 (GAUZE/BANDAGES/DRESSINGS) ×3 IMPLANT
COVER WAND RF STERILE (DRAPES) IMPLANT
DECANTER SPIKE VIAL GLASS SM (MISCELLANEOUS) IMPLANT
DERMABOND ADVANCED (GAUZE/BANDAGES/DRESSINGS) ×1
DERMABOND ADVANCED .7 DNX12 (GAUZE/BANDAGES/DRESSINGS) ×2 IMPLANT
DIFFUSER DRILL AIR PNEUMATIC (MISCELLANEOUS) ×3 IMPLANT
DRAPE C-ARM 42X72 X-RAY (DRAPES) ×6 IMPLANT
DRAPE LAPAROTOMY 100X72 PEDS (DRAPES) ×3 IMPLANT
DRAPE MICROSCOPE LEICA (MISCELLANEOUS) IMPLANT
DRAPE SHEET LG 3/4 BI-LAMINATE (DRAPES) ×3 IMPLANT
DRAPE SURG 17X23 STRL (DRAPES) ×12 IMPLANT
DRSG OPSITE 4X5.5 SM (GAUZE/BANDAGES/DRESSINGS) ×3 IMPLANT
DRSG OPSITE POSTOP 4X8 (GAUZE/BANDAGES/DRESSINGS) ×3 IMPLANT
DURAPREP 26ML APPLICATOR (WOUND CARE) ×3 IMPLANT
ELECT BLADE 4.0 EZ CLEAN MEGAD (MISCELLANEOUS) ×3
ELECT REM PT RETURN 9FT ADLT (ELECTROSURGICAL) ×3
ELECTRODE BLDE 4.0 EZ CLN MEGD (MISCELLANEOUS) ×2 IMPLANT
ELECTRODE REM PT RTRN 9FT ADLT (ELECTROSURGICAL) ×2 IMPLANT
EVACUATOR 1/8 PVC DRAIN (DRAIN) ×3 IMPLANT
GAUZE 4X4 16PLY RFD (DISPOSABLE) IMPLANT
GAUZE SPONGE 4X4 12PLY STRL (GAUZE/BANDAGES/DRESSINGS) IMPLANT
GAUZE SPONGE 4X4 12PLY STRL LF (GAUZE/BANDAGES/DRESSINGS) ×3 IMPLANT
GLOVE BIO SURGEON STRL SZ7 (GLOVE) ×9 IMPLANT
GLOVE BIO SURGEON STRL SZ8 (GLOVE) ×9 IMPLANT
GLOVE EXAM NITRILE XL STR (GLOVE) IMPLANT
GLOVE INDICATOR 8.5 STRL (GLOVE) ×9 IMPLANT
GLOVE SURG PR MICRO ENCORE 7 (GLOVE) ×6 IMPLANT
GLOVE SURG PR MICRO ENCORE 7.5 (GLOVE) IMPLANT
GLOVE SURG UNDER POLY LF SZ7 (GLOVE) ×12 IMPLANT
GLOVE SURG UNDER POLY LF SZ7.5 (GLOVE) ×9 IMPLANT
GOWN STRL REUS W/ TWL LRG LVL3 (GOWN DISPOSABLE) ×6 IMPLANT
GOWN STRL REUS W/ TWL XL LVL3 (GOWN DISPOSABLE) ×8 IMPLANT
GOWN STRL REUS W/TWL 2XL LVL3 (GOWN DISPOSABLE) IMPLANT
GOWN STRL REUS W/TWL LRG LVL3 (GOWN DISPOSABLE) ×3
GOWN STRL REUS W/TWL XL LVL3 (GOWN DISPOSABLE) ×4
HEMOSTAT POWDER KIT SURGIFOAM (HEMOSTASIS) ×3 IMPLANT
IMPL QUARTEX 3.5X12MM (Neuro Prosthesis/Implant) ×4 IMPLANT
IMPLANT QUARTEX 3.5X12MM (Neuro Prosthesis/Implant) ×6 IMPLANT
KIT BASIN OR (CUSTOM PROCEDURE TRAY) ×3 IMPLANT
KIT SPINE MAZOR X ROBO DISP (MISCELLANEOUS) ×3 IMPLANT
KIT TURNOVER KIT B (KITS) ×3 IMPLANT
LOCKING CAP (Cap) ×12 IMPLANT
MARKER SKIN DUAL TIP RULER LAB (MISCELLANEOUS) ×6 IMPLANT
NEEDLE HYPO 25X1 1.5 SAFETY (NEEDLE) ×3 IMPLANT
NEEDLE SPNL 20GX3.5 QUINCKE YW (NEEDLE) IMPLANT
NS IRRIG 1000ML POUR BTL (IV SOLUTION) ×3 IMPLANT
OIL CARTRIDGE MAESTRO DRILL (MISCELLANEOUS) ×3
PACK LAMINECTOMY NEURO (CUSTOM PROCEDURE TRAY) ×3 IMPLANT
PAD ARMBOARD 7.5X6 YLW CONV (MISCELLANEOUS) ×9 IMPLANT
PIN HEAD 2.5X60MM (PIN) IMPLANT
PIN MAYFIELD SKULL DISP (PIN) ×3 IMPLANT
ROD CURVED CAP 3.5X45 (Rod) ×3 IMPLANT
ROD CVD QUARTEX 3.5X35 (Rod) ×3 IMPLANT
SCREW 3.5X20MM (Screw) ×2 IMPLANT
SCREW BN 20X3.5XPA NS LF (Screw) ×4 IMPLANT
SCREW SCHANZ SA 4.0MM (MISCELLANEOUS) IMPLANT
SPONGE LAP 4X18 RFD (DISPOSABLE) IMPLANT
SPONGE SURGIFOAM ABS GEL 100 (HEMOSTASIS) ×3 IMPLANT
STRIP CLOSURE SKIN 1/2X4 (GAUZE/BANDAGES/DRESSINGS) ×3 IMPLANT
SUT ETHILON 4 0 PS 2 18 (SUTURE) IMPLANT
SUT VIC AB 0 CT1 18XCR BRD8 (SUTURE) ×2 IMPLANT
SUT VIC AB 0 CT1 8-18 (SUTURE) ×1
SUT VIC AB 2-0 CT1 18 (SUTURE) ×6 IMPLANT
SUT VIC AB 4-0 PS2 27 (SUTURE) ×3 IMPLANT
TOWEL GREEN STERILE (TOWEL DISPOSABLE) ×3 IMPLANT
TOWEL GREEN STERILE FF (TOWEL DISPOSABLE) ×3 IMPLANT
TRAY FOLEY MTR SLVR 16FR STAT (SET/KITS/TRAYS/PACK) IMPLANT
TUBE MAZOR SA REDUCTION (TUBING) ×3 IMPLANT
WATER STERILE IRR 1000ML POUR (IV SOLUTION) ×3 IMPLANT

## 2021-01-31 NOTE — Anesthesia Postprocedure Evaluation (Signed)
Anesthesia Post Note  Patient: Tammie Vasquez  Procedure(s) Performed: Posterior cervical fusion with lateral mass fixation - Cervical six-Thoracic one with right-sided foraminotomy of the Cervical eight nerve root (Spine Cervical) APPLICATION OF ROBOTIC ASSISTANCE FOR SPINAL PROCEDURE     Patient location during evaluation: PACU Anesthesia Type: General Level of consciousness: awake and alert, patient cooperative and oriented Pain control: pain improving. Vital Signs Assessment: post-procedure vital signs reviewed and stable Respiratory status: spontaneous breathing, nonlabored ventilation and respiratory function stable Cardiovascular status: blood pressure returned to baseline and stable Postop Assessment: no apparent nausea or vomiting, able to ambulate and adequate PO intake Anesthetic complications: no   No notable events documented.  Last Vitals:  Vitals:   01/31/21 1340 01/31/21 1425  BP: (!) 99/53 (!) 108/44  Pulse: 87 89  Resp: 17 (!) 21  Temp:  (!) 36.1 C  SpO2: 100% 94%    Last Pain:  Vitals:   01/31/21 1425  TempSrc:   PainSc: 10-Worst pain ever                 Romie Keeble,E. Katelynd Blauvelt

## 2021-01-31 NOTE — Transfer of Care (Signed)
Immediate Anesthesia Transfer of Care Note  Patient: Tammie Vasquez  Procedure(s) Performed: Posterior cervical fusion with lateral mass fixation - Cervical six-Thoracic one with right-sided foraminotomy of the Cervical eight nerve root (Spine Cervical) APPLICATION OF ROBOTIC ASSISTANCE FOR SPINAL PROCEDURE  Patient Location: PACU  Anesthesia Type:General  Level of Consciousness: awake, alert , oriented and patient cooperative  Airway & Oxygen Therapy: Patient Spontanous Breathing  Post-op Assessment: Report given to RN, Post -op Vital signs reviewed and stable and Patient moving all extremities X 4  Post vital signs: Reviewed and stable  Last Vitals:  Vitals Value Taken Time  BP 93/63 01/31/21 1240  Temp 36.3 C 01/31/21 1240  Pulse 83 01/31/21 1242  Resp 14 01/31/21 1242  SpO2 95 % 01/31/21 1242  Vitals shown include unvalidated device data.  Last Pain:  Vitals:   01/31/21 1240  TempSrc:   PainSc: Asleep      Patients Stated Pain Goal: 5 (01/31/21 0718)  Complications: No notable events documented.

## 2021-01-31 NOTE — Op Note (Signed)
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  Posterior cervical fusion from C6-T1 utilizing the O-arm stereotactic navigation system with intraoperative CT and placement of bilateral T1 pedicle screws and lateral mass screws at C6 utilizing the globus ellipse screw system  3.  Posterolateral arthrodesis C6-T1 utilizing locally harvested autograft mixed with the vivigen  Surgeon: Jillyn Hidden Mareli Antunes  Assistant: Barbaraann Barthel  Assistant to: Julien Girt  Anesthesia: General  EBL: Minimal  HPI: 70 year old female underwent ACDF C6-7 couple months ago start experiencing increased neck pain recurrent right C7 radiculopathy work-up with CT scan and plain films show partial displacement of the inferior aspect of the plate at C7 and partial subsidence of the implant.  Due to the location and the implant being primarily still in apposition with the endplates of vertebral bodies and minimal anterior displacement of the plate I elected to augment this posteriorly I recommended the patient.  I recommended posterior cervical fusion from C6-T1 as her C7 pedicles and lateral masses were minimal and very small with a redo laminotomy of the C7 nerve root on the right from a posterior approach.  I extensively went over the risks and benefits of the operation with her as well as perioperative course expectations of outcome and alternatives of surgery and she understood and agreed to proceed forward.  Operative procedure: Patient was brought into the OR was due to general esthesia positioned prone and pins the backside of her head neck was prepped and draped in routine sterile fashion utilizing bony anatomic landmarks a midline incision was made after infiltration of 10 cc lidocaine  with epi and subperiosteal dissection was carried out and the lamina from C5 down to T2.  Intraoperative x-rays identified the appropriate level so then performed a laminotomy on the right at C7 and C6 identified thecal sac tracked out the C7 nerve root there was marked stenosis quite inflammatory phlegmon overlying the superior aspect the nerve root this was all removed decompressing the C7 nerve root and skeletonizing it and opening it up and unroofing the foramen.  Perform partial facetectomy during the foraminotomies.  Then we placed the reference array on the T2 spinous process hooked up the O-arm to an intraoperative CT and then utilizing stereotactic navigation placed bilateral T1 pedicle screws both screws with excellent purchase and the holes were probed and each step along the way.  I then placed bilateral C6 laminar lateral mass screws the C7 lateral mass was not conducive and the C7 pedicles were also very small and soft and the fact that we performed a foraminotomy on the right there was enough room to put screws in 7 so I just looked up at C6-T1 aggressively decorticated the lateral masses facet joints and lamina complexes and packed extensive monovision posterolaterally sized up to rods anchored happening in place and tightened everything down.  We did run another spin and post screw placement to confirm adequate position of all the implants.  I then placed a medium Hemovac drain copiously irrigated the wound and closed the wound in layers with Vicryl and a running 4 subcuticular Dermabond benzoin Steri-Strips and a sterile dressing was applied and patient went to cover him in stable condition.  At the end the case all needle counts and sponge counts were correct.

## 2021-01-31 NOTE — Anesthesia Procedure Notes (Signed)
Procedure Name: Intubation Date/Time: 01/31/2021 8:45 AM Performed by: Aundria Rud, CRNA Pre-anesthesia Checklist: Patient identified, Emergency Drugs available, Suction available and Patient being monitored Patient Re-evaluated:Patient Re-evaluated prior to induction Oxygen Delivery Method: Circle System Utilized Preoxygenation: Pre-oxygenation with 100% oxygen Induction Type: IV induction Ventilation: Mask ventilation without difficulty Laryngoscope Size: Glidescope and 3 Grade View: Grade I Tube type: Oral Tube size: 7.0 mm Number of attempts: 1 Airway Equipment and Method: Stylet Placement Confirmation: ETT inserted through vocal cords under direct vision, positive ETCO2 and breath sounds checked- equal and bilateral Secured at: 21 cm Tube secured with: Tape Dental Injury: Teeth and Oropharynx as per pre-operative assessment

## 2021-01-31 NOTE — H&P (Signed)
Tammie Vasquez is an 70 y.o. female.   Chief Complaint: Neck pain right arm pain HPI: 70 year old female underwent an ACDF at C67 initially did very well however started experiencing worsening neck pain and recurrent right C7 radicular symptoms serial x-ray showed some partial subsidence of the implant and a little bit of anterior displacement of the bottom of the plate.  Still most of the implant was in apposition the vertebral body endplates and plate was in an acceptable position however to keep it from migrating anymore and to allow her to fuse in that location I recommended posterior augmentation with screws at C6-C7 possible T1 with posterior lateral arthrodesis at those levels and a foraminotomy on the right C7.  I extensively gone over the risks and benefits of that operation with her as well as perioperative course expectations of outcome and alternatives to surgery and she understood and agreed to proceed forward.  Past Medical History:  Diagnosis Date   Arthritis    Coronary artery disease    Depression    GERD (gastroesophageal reflux disease)    Headache    migraines   High cholesterol    Hypertension    Myocardial infarction (HCC)    Pneumonia     Past Surgical History:  Procedure Laterality Date   ABDOMINAL HYSTERECTOMY     ANTERIOR CERVICAL DECOMP/DISCECTOMY FUSION N/A 10/20/2020   Procedure: ANTERIOR CERVICAL DECOMPRESSION FUSION  - CERVICAL SIX-CERVICAL SEVEN WITH EXPLORATION REMOVAL HARDWARE  CERVICAL FOUR-CERVICAL SIX;  Surgeon: Donalee Citrin, MD;  Location: [facility] OR;  Service: Neurosurgery;  Laterality: N/A;   APPENDECTOMY     CARDIAC CATHETERIZATION     stent placed at age 58   CERVICAL SPINE SURGERY     CHOLECYSTECTOMY     DIAGNOSTIC LAPAROSCOPY     lap chole   EYE SURGERY Left    laser eye sx.   FOOT FRACTURE SURGERY Left    REFRACTIVE SURGERY     TONSILLECTOMY      History reviewed. No pertinent family history. Social History:  reports that she quit smoking about  9 years ago. Her smoking use included cigarettes. She has never used smokeless tobacco. She reports current alcohol use. She reports that she does not use drugs.  Allergies: No Known Allergies  Medications Prior to Admission  Medication Sig Dispense Refill   acetaminophen (TYLENOL) 325 MG tablet Take 650 mg by mouth every 6 (six) hours as needed for moderate pain.     atorvastatin (LIPITOR) 20 MG tablet Take 20 mg by mouth daily.     buPROPion (WELLBUTRIN XL) 150 MG 24 hr tablet Take 150 mg by mouth daily.     cyclobenzaprine (FLEXERIL) 10 MG tablet Take 10 mg by mouth 2 (two) times daily as needed for muscle spasms.     ibuprofen (ADVIL) 200 MG tablet Take 400 mg by mouth every 8 (eight) hours as needed for mild pain.     lisinopril (ZESTRIL) 10 MG tablet Take 10 mg by mouth daily.     meloxicam (MOBIC) 15 MG tablet Take 15 mg by mouth daily.     metoprolol succinate (TOPROL-XL) 25 MG 24 hr tablet Take 25 mg by mouth daily.     naproxen sodium (ALEVE) 220 MG tablet Take 660 mg by mouth daily as needed (Pain).     pantoprazole (PROTONIX) 20 MG tablet Take 20 mg by mouth daily.     Probiotic Product (PROBIOTIC PO) Take 1 capsule by mouth daily.     sertraline (  ZOLOFT) 100 MG tablet Take 100 mg by mouth daily.     traMADol (ULTRAM) 50 MG tablet Take 50 mg by mouth every 6 (six) hours as needed.     zolpidem (AMBIEN) 10 MG tablet Take 10 mg by mouth at bedtime.      Results for orders placed or performed during the hospital encounter of 01/29/21 (from the past 48 hour(s))  Surgical pcr screen     Status: Abnormal   Collection Time: 01/29/21  9:58 AM   Specimen: Nasal Mucosa; Nasal Swab  Result Value Ref Range   MRSA, PCR NEGATIVE NEGATIVE   Staphylococcus aureus POSITIVE (A) NEGATIVE    Comment: (NOTE) The Xpert SA Assay (FDA approved for NASAL specimens in patients 39 years of age and older), is one component of a comprehensive surveillance program. It is not intended to diagnose  infection nor to guide or monitor treatment. Performed at [facility], [address], [place], [place] [address]   SARS CORONAVIRUS 2 (TAT 6-24 HRS) Nasopharyngeal Nasopharyngeal Swab     Status: None   Collection Time: 01/29/21  9:59 AM   Specimen: Nasopharyngeal Swab  Result Value Ref Range   SARS Coronavirus 2 NEGATIVE NEGATIVE    Comment: (NOTE) SARS-CoV-2 target nucleic acids are NOT DETECTED.  The SARS-CoV-2 RNA is generally detectable in upper and lower respiratory specimens during the acute phase of infection. Negative results do not preclude SARS-CoV-2 infection, do not rule out co-infections with other pathogens, and should not be used as the sole basis for treatment or other patient management decisions. Negative results must be combined with clinical observations, patient history, and epidemiological information. The expected result is Negative.  Fact Sheet for Patients: HairSlick.no  Fact Sheet for Healthcare Providers: quierodirigir.com  This test is not yet approved or cleared by the [place] FDA and  has been authorized for detection and/or diagnosis of SARS-CoV-2 by FDA under an Emergency Use Authorization (EUA). This EUA will remain  in effect (meaning this test can be used) for the duration of the COVID-19 declaration under Se ction 564(b)(1) of the Act, 21 U.S.C. section 360bbb-3(b)(1), unless the authorization is terminated or revoked sooner.  Performed at [facility], [address] [place], [place] [address]   Basic metabolic panel per protocol     Status: Abnormal   Collection Time: 01/29/21 10:31 AM  Result Value Ref Range   Sodium 137 135 - 145 mmol/L   Potassium 4.5 3.5 - 5.1 mmol/L   Chloride 99 98 - 111 mmol/L   CO2 28 22 - 32 mmol/L   Glucose, Bld 108 (H) 70 - 99 mg/dL    Comment: Glucose reference range applies only to samples taken after fasting for at least 8  hours.   BUN 11 8 - 23 mg/dL   Creatinine, Ser 5.18 (H) 0.44 - 1.00 mg/dL   Calcium 84.1 8.9 - 66.0 mg/dL   GFR, Estimated 54 (L) >60 mL/min    Comment: (NOTE) Calculated using the CKD-EPI Creatinine Equation (2021)    Anion gap 10 5 - 15    Comment: Performed at [facility], [address] [place], [place] [address]  CBC per protocol     Status: None   Collection Time: 01/29/21 10:31 AM  Result Value Ref Range   WBC 7.0 4.0 - 10.5 K/uL   RBC 4.60 3.87 - 5.11 MIL/uL   Hemoglobin 13.3 12.0 - 15.0 g/dL   HCT 01.0 93.2 -  46.0 %   MCV 88.9 80.0 - 100.0 fL   MCH 28.9 26.0 - 34.0 pg   MCHC 32.5 30.0 - 36.0 g/dL   RDW 95.1 88.4 - 16.6 %   Platelets 233 150 - 400 K/uL   nRBC 0.0 0.0 - 0.2 %    Comment: Performed at [facility], [address], [place], [place] [address]  Type and screen [facility]     Status: None   Collection Time: 01/29/21 10:46 AM  Result Value Ref Range   ABO/RH(D) A POS    Antibody Screen NEG    Sample Expiration 02/12/2021,2359    Extend sample reason      NO TRANSFUSIONS OR PREGNANCY IN THE PAST 3 MONTHS Performed at [facility], [address] [place], [place] [address]    No results found.  Review of Systems  Neurological:  Positive for weakness, numbness and headaches.   Blood pressure (!) 123/56, pulse 81, temperature 98.4 F (36.9 C), resp. rate 17, height 5\' 3"  (1.6 m), weight 80.3 kg, SpO2 100 %. Physical Exam HENT:     Head: Normocephalic.     Right Ear: Tympanic membrane normal.     Nose: Nose normal.     Mouth/Throat:     Mouth: Mucous membranes are moist.  Cardiovascular:     Rate and Rhythm: Normal rate.  Pulmonary:     Effort: Pulmonary effort is normal.  Abdominal:     General: Abdomen is flat.  Musculoskeletal:        General: Normal range of motion.  Skin:    General: Skin is warm.  Neurological:     General: No focal deficit present.     Mental Status: She is alert.     Comments:  Patient is awake and alert strength is 5 out of 5 deltoid, bicep, tricep on the right is weak at 4 to 4+ out of 5 otherwise distally 5 out of 5 in left upper extremity 5 out of 5 hand intrinsic strength is difficult to monitor secondary to IV.     Assessment/Plan 70 year old presents for posterior cervical foraminotomy C7 posterior lateral arthrodesis with instrumentation C6 to possibly T1  61, MD 01/31/2021, 8:29 AM

## 2021-01-31 NOTE — Progress Notes (Signed)
Orthopedic Tech Progress Note Patient Details:  Tammie Vasquez 15-Jan-1951 [id number]  RN stated "patient has collar"   Patient ID: Tammie Vasquez, female   DOB: Mar 22, 1951, 70 y.o.   MRN: [id number]  Tammie Vasquez 01/31/2021, 3:59 PM

## 2021-02-01 MED ORDER — OXYCODONE-ACETAMINOPHEN 7.5-325 MG PO TABS: 1.0000 | ORAL_TABLET | ORAL | 0 refills | Status: AC | PRN

## 2021-02-01 MED FILL — Thrombin For Soln Kit 20000 Unit: CUTANEOUS | Qty: 1 | Status: AC

## 2021-02-01 NOTE — Progress Notes (Signed)
Patient is discharged from room 3C09 at this time. Alert and in stable condition. IV site d/c'd and instructions read to patient and sister with understanding verbalized and all questions answered. Left unit via wheelchair with all belongings at side. 

## 2021-02-01 NOTE — Discharge Summary (Signed)
Physician Discharge Summary  Patient ID: Tammie Vasquez MRN: [id number] DOB/AGE: 1951-03-28 70 y.o.  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 The patient tolerated the procedure well and was taken to the recovery room and then to the floor in stable condition. The hospital course was routine. There were no complications. The wound remained clean dry and intact. Pt had appropriate neck soreness. No complaints of arm pain or new N/T/W. The patient remained afebrile with stable vital signs, and tolerated a regular diet. The patient continued to increase activities, and pain was well controlled with oral pain medications.   Consults: None  Significant Diagnostic Studies:  Results for orders placed or performed during the hospital encounter of 01/31/21  ABO/Rh  Result Value Ref Range   ABO/RH(D)      A POS Performed at [facility], [address], [place], [place] [address]     DG Cervical Spine 2 or 3 views  Result Date: 01/31/2021 CLINICAL DATA:  Posterior cervical fusion with lateral mass fixation C7-T1. Right-sided foraminotomy. Images were obtained for localization during surgery. These are not the final images from the fusion. EXAM: DG C-ARM 1-60 MIN; CERVICAL SPINE - 2-3 VIEW FLUOROSCOPY TIME:  Fluoroscopy Time:  23 seconds Radiation Exposure Index (if provided by the fluoroscopic device): 11 Number of Acquired Spot Images: 2 COMPARISON:  CT cervical spine Dec 30, 2020. FINDINGS: Two C-arm fluoroscopic images were obtained intraoperatively and submitted for post operative interpretation. These images demonstrate surgical  probe projecting posteriorly at approximately the C6-C7 level on the lateral. Prior C4-C5 and C6-C7 anterior plate screw fixation with intervening graft at C6-C7, better characterized on recent CT cervical spine. Please see the performing provider's procedural report for further detail. IMPRESSION: Intraoperative fluoroscopy, as detailed above. Electronically Signed   By: Feliberto Harts MD   On: 01/31/2021 12:56   DG C-Arm 1-60 Min  Result Date: 01/31/2021 CLINICAL DATA:  Posterior cervical fusion with lateral mass fixation C7-T1. Right-sided foraminotomy. Images were obtained for localization during surgery. These are not the final images from the fusion. EXAM: DG C-ARM 1-60 MIN; CERVICAL SPINE - 2-3 VIEW FLUOROSCOPY TIME:  Fluoroscopy Time:  23 seconds Radiation Exposure Index (if provided by the fluoroscopic device): 11 Number of Acquired Spot Images: 2 COMPARISON:  CT cervical spine Dec 30, 2020. FINDINGS: Two C-arm fluoroscopic images were obtained intraoperatively and submitted for post operative interpretation. These images demonstrate surgical probe projecting posteriorly at approximately the C6-C7 level on the lateral. Prior C4-C5 and C6-C7 anterior plate screw fixation with intervening graft at C6-C7, better characterized on recent CT cervical spine. Please see the performing provider's procedural report for further detail. IMPRESSION: Intraoperative fluoroscopy, as detailed above. Electronically Signed   By: Feliberto Harts MD   On: 01/31/2021 12:56   DG C-Arm 1-60 Min-No Report  Result Date: 01/31/2021 Fluoroscopy was utilized by the requesting physician.  No radiographic interpretation.    Antibiotics:  Anti-infectives (From admission, onward)    Start     Dose/Rate Route Frequency Ordered Stop   01/31/21 1700  ceFAZolin (ANCEF) IVPB 2g/100 mL premix        2 g 200 mL/hr over 30 Minutes Intravenous  Every 8 hours 01/31/21 1529 02/01/21 0022   01/31/21 0715  ceFAZolin (ANCEF) IVPB  2g/100 mL premix        2 g 200 mL/hr over 30 Minutes Intravenous On call to O.R. 01/31/21 0701 01/31/21 0849       Discharge Exam: Blood pressure (!) 147/83, pulse 95, temperature 98.6 F (37 C), temperature source Oral, resp. rate 18, height 5\' 3"  (1.6 m), weight 80.3 kg, SpO2 99 %. Neurologic: Grossly normal Ambulating and voiding well, incision cdi   Discharge Medications:   Allergies as of 02/01/2021   No Known Allergies      Medication List     TAKE these medications    acetaminophen 325 MG tablet Commonly known as: TYLENOL Take 650 mg by mouth every 6 (six) hours as needed for moderate pain.   atorvastatin 20 MG tablet Commonly known as: LIPITOR Take 20 mg by mouth daily.   buPROPion 150 MG 24 hr tablet Commonly known as: WELLBUTRIN XL Take 150 mg by mouth daily.   cyclobenzaprine 10 MG tablet Commonly known as: FLEXERIL Take 10 mg by mouth 2 (two) times daily as needed for muscle spasms.   ibuprofen 200 MG tablet Commonly known as: ADVIL Take 400 mg by mouth every 8 (eight) hours as needed for mild pain.   lisinopril 10 MG tablet Commonly known as: ZESTRIL Take 10 mg by mouth daily.   meloxicam 15 MG tablet Commonly known as: MOBIC Take 15 mg by mouth daily.   metoprolol succinate 25 MG 24 hr tablet Commonly known as: TOPROL-XL Take 25 mg by mouth daily.   naproxen sodium 220 MG tablet Commonly known as: ALEVE Take 660 mg by mouth daily as needed (Pain).   oxyCODONE-acetaminophen 7.5-325 MG tablet Commonly known as: Percocet Take 1 tablet by mouth every 4 (four) hours as needed for severe pain.   pantoprazole 20 MG tablet Commonly known as: PROTONIX Take 20 mg by mouth daily.   PROBIOTIC PO Take 1 capsule by mouth daily.   sertraline 100 MG tablet Commonly known as: ZOLOFT Take 100 mg by mouth daily.   traMADol 50 MG tablet Commonly known as: ULTRAM Take 50 mg by mouth every 6 (six) hours as needed.   zolpidem 10 MG  tablet Commonly known as: AMBIEN Take 10 mg by mouth at bedtime.        Disposition: home   Final Dx: posterior cervical fusion C7-T1 with foraminotomy C7 root  Discharge Instructions      Remove dressing in 72 hours   Complete by: As directed    Call MD for:  difficulty breathing, headache or visual disturbances   Complete by: As directed    Call MD for:  hives   Complete by: As directed    Call MD for:  persistant dizziness or light-headedness   Complete by: As directed    Call MD for:  persistant nausea and vomiting   Complete by: As directed    Call MD for:  redness, tenderness, or signs of infection (pain, swelling, redness, odor or green/yellow discharge around incision site)   Complete by: As directed    Call MD for:  severe uncontrolled pain   Complete by: As directed    Call MD for:  temperature >100.4   Complete by: As directed    Diet - low sodium heart healthy   Complete by: As directed    Driving Restrictions   Complete by: As directed    No driving for 2  weeks, no riding in the car for 1 week   Increase activity slowly   Complete by: As directed    Lifting restrictions   Complete by: As directed    No lifting more than 8 lbs          Signed: Tiana Loft Norleen Xie 02/01/2021, 8:11 AM

## 2021-02-01 NOTE — Discharge Instructions (Signed)
Wound Care  Keep the incision clean and dry remove the outer dressing in 2 days, leave the Steri-Strips intact.  Do not put any creams, lotions, or ointments on incision. Leave steri-strips on neck.  They will fall off by themselves.  Activity Walk each and every day, increasing distance each day. No lifting greater than 5 lbs.  Avoid excessive neck motion. No lifting no bending no twisting no driving or riding a car unless coming back and forth to see me. Wear neck brace at all times except when showering.   Diet Resume your normal diet.   Return to Work Will be discussed at you follow up appointment.  Call Your Doctor If Any of These Occur Redness, drainage, or swelling at the wound.  Temperature greater than 101 degrees. Severe pain not relieved by pain medication. Incision starts to come apart.  Follow Up Appt Call today for appointment in 1-2 weeks ([phone]) or for problems.  If you have any hardware placed in your spine, you will need an x-ray before your appointment.   

## 2021-02-01 NOTE — Evaluation (Signed)
Physical Therapy Evaluation Patient Details Name: Tammie Vasquez MRN: [id number] DOB: 1950/10/30 Today's Date: 02/01/2021   History of Present Illness  Pt is a 70 y/o female who presents s/p C6-T1 posterior cervical fusion on 01/31/2021. PMH significant for CAD, HTN, MI, pneumonia, ACDF, and foot fracture surgery.   Clinical Impression  Pt admitted with above diagnosis. At the time of PT eval, pt was able to demonstrate transfers and ambulation with gross supervision for safety and no AD. Pt was educated on precautions, brace application/wearing schedule, appropriate activity progression, and car transfer. Pt currently with functional limitations due to the deficits listed below (see PT Problem List). Pt will benefit from skilled PT to increase their independence and safety with mobility to allow discharge to the venue listed below.      Follow Up Recommendations No PT follow up;Supervision for mobility/OOB    Equipment Recommendations  None recommended by PT    Recommendations for Other Services       Precautions / Restrictions Precautions Precautions: Cervical;Fall Precaution Booklet Issued: Yes (comment) Precaution Comments: Reviewed handout and pt was cued for precautions during functional mobility. Required Braces or Orthoses: Cervical Brace Cervical Brace: Soft collar Restrictions Weight Bearing Restrictions: No      Mobility  Bed Mobility               General bed mobility comments: Pt was received sitting up in the chair.    Transfers Overall transfer level: Needs assistance Equipment used: None Transfers: Sit to/from Stand Sit to Stand: Supervision         General transfer comment: VC's for improved posture. No unsteadiness or LOB noted.  Ambulation/Gait Ambulation/Gait assistance: Supervision Gait Distance (Feet): 400 Feet Assistive device: None Gait Pattern/deviations: Step-through pattern;Decreased stride length;Trunk flexed Gait velocity:  Decreased Gait velocity interpretation: 1.31 - 2.62 ft/sec, indicative of limited community ambulator General Gait Details: VC's for improved posture. Pt maintaining precautions well.  Stairs            Wheelchair Mobility    Modified Rankin (Stroke Patients Only)       Balance Overall balance assessment: Needs assistance Sitting-balance support: Feet supported;No upper extremity supported Sitting balance-Leahy Scale: Fair     Standing balance support: No upper extremity supported;During functional activity Standing balance-Leahy Scale: Fair                               Pertinent Vitals/Pain Pain Assessment: Faces Faces Pain Scale: Hurts a little bit Pain Location: posterior neck/incision site, and shoulders Pain Descriptors / Indicators: Operative site guarding Pain Intervention(s): Limited activity within patient's tolerance;Monitored during session;Repositioned    Home Living Family/patient expects to be discharged to:: Private residence Living Arrangements: Spouse/significant other Available Help at Discharge: Family;Available 24 hours/day Type of Home: House Home Access: Level entry     Home Layout: Two level;Able to live on main level with bedroom/bathroom Home Equipment: Shower seat - built in;Walker - 2 wheels;Hand held shower head Additional Comments: Pt reports husband is retired and available to assist 24/7    Prior Function Level of Independence: Independent               Hand Dominance        Extremity/Trunk Assessment   Upper Extremity Assessment Upper Extremity Assessment: Generalized weakness (Pt reports improvement in numbness in R hand)    Lower Extremity Assessment Lower Extremity Assessment: Generalized weakness    Cervical / Trunk Assessment  Cervical / Trunk Assessment: Other exceptions Cervical / Trunk Exceptions: s/p surgery  Communication   Communication: No difficulties  Cognition Arousal/Alertness:  Awake/alert Behavior During Therapy: WFL for tasks assessed/performed Overall Cognitive Status: Within Functional Limits for tasks assessed                                 General Comments: Pt pleasant and conversational throughout session.      General Comments General comments (skin integrity, edema, etc.): Pt dyspneic after ambulation 3/4 but rapid improvement with seated rest break. Of note, pt was talking throughout the entirety of gait training which may have added to her dyspnea.    Exercises     Assessment/Plan    PT Assessment Patient needs continued PT services  PT Problem List Decreased strength;Decreased activity tolerance;Decreased balance;Decreased mobility;Decreased knowledge of use of DME;Decreased safety awareness;Decreased knowledge of precautions;Cardiopulmonary status limiting activity;Pain       PT Treatment Interventions DME instruction;Gait training;Therapeutic activities;Functional mobility training;Therapeutic exercise;Balance training;Neuromuscular re-education;Cognitive remediation;Patient/family education    PT Goals (Current goals can be found in the Care Plan section)  Acute Rehab PT Goals Patient Stated Goal: Home today, and no more surgeries PT Goal Formulation: With patient Time For Goal Achievement: 02/08/21 Potential to Achieve Goals: Good    Frequency Min 5X/week   Barriers to discharge        Co-evaluation               AM-PAC PT "6 Clicks" Mobility  Outcome Measure Help needed turning from your back to your side while in a flat bed without using bedrails?: None Help needed moving from lying on your back to sitting on the side of a flat bed without using bedrails?: None Help needed moving to and from a bed to a chair (including a wheelchair)?: None Help needed standing up from a chair using your arms (e.g., wheelchair or bedside chair)?: A Little Help needed to walk in hospital room?: A Little Help needed climbing  3-5 steps with a railing? : A Little 6 Click Score: 21    End of Session Equipment Utilized During Treatment: Gait belt;Cervical collar Activity Tolerance: Patient tolerated treatment well Patient left: with call bell/phone within reach;with family/visitor present (sitting EOB with visitor present) Nurse Communication: Mobility status PT Visit Diagnosis: Unsteadiness on feet (R26.81);Pain Pain - part of body:  (neck)    Time: 7253-6644 PT Time Calculation (min) (ACUTE ONLY): 12 min   Charges:   PT Evaluation $PT Eval Low Complexity: 1 Low          Conni Slipper, PT, DPT Acute Rehabilitation Services Pager: [phone] Office: [phone]   Marylynn Pearson 02/01/2021, 11:20 AM

## 2021-02-01 NOTE — Evaluation (Signed)
Occupational Therapy Evaluation/Discharge  Patient Details Name: Tammie Vasquez MRN: [id number] DOB: 11/12/1950 Today's Date: 02/01/2021    History of Present Illness 70 y.o. female s/p posterior cervical fusion with lateral mass fixation - C7-T1 with right-sided foraminotomy of the C8 nerve root and with O arm 12/29/2020. PMH significant for arthritis, coronary artery diseasem depression, high cholesterol, HTN, MI, pneumonia, ACDF, appendectomy, and foot fracture surgery.   Clinical Impression   PTA, pt was independent and lived with her husband. Currently, pt completes ADLs and functional mobility with mod I, but needs min A with UB dressing to bring button up shirt around back. Pt educated and demonstrated use of compensatory techniques for UB dressing, LB dressing, toilet transfers, toilet hygiene, shower transrfer, and brace application within precautions. Pt reports her husband will be available 24/7 at discharge to assist as needed. All questions answered. Recommend discharge home with no follow up OT at this time. Re-consult if change in status. OT to sign off.     Follow Up Recommendations  No OT follow up    Equipment Recommendations  None recommended by OT    Recommendations for Other Services       Precautions / Restrictions Precautions Precautions: Cervical Precaution Booklet Issued: Yes (comment) Precaution Comments: All precautions and education reviewed. Pt verbalized understanding. Required Braces or Orthoses: Cervical Brace Cervical Brace: Soft collar Restrictions Weight Bearing Restrictions: No      Mobility Bed Mobility Overal bed mobility: Modified Independent             General bed mobility comments: Pt performing proper use of log roll technique to return to bed    Transfers Overall transfer level: Modified independent Equipment used: None Transfers: Sit to/from Stand Sit to Stand: Modified independent (Device/Increase time)         General  transfer comment: VC's for improved posture. No unsteadiness or LOB noted.    Balance Overall balance assessment: Modified Independent Sitting-balance support: Feet supported;No upper extremity supported Sitting balance-Leahy Scale: Good     Standing balance support: No upper extremity supported;During functional activity Standing balance-Leahy Scale: Fair                             ADL either performed or assessed with clinical judgement   ADL Overall ADL's : Needs assistance/impaired Eating/Feeding: Modified independent;Sitting   Grooming: Modified independent;Standing   Upper Body Bathing: Modified independent;Sitting   Lower Body Bathing: Modified independent;Sit to/from stand   Upper Body Dressing : Sitting;Minimal assistance Upper Body Dressing Details (indicate cue type and reason): Pt requiring min A to bring shirt around back. Pt reports husband can assist with this at home. Lower Body Dressing: Modified independent;Sit to/from stand   Toilet Transfer: Modified Independent;Ambulation;Comfort height toilet   Toileting- Clothing Manipulation and Hygiene: Supervision/safety;Sit to/from stand Toileting - Clothing Manipulation Details (indicate cue type and reason): Pt educated on toilet hygiene within precautions. Pt reports husband can assist with back side if needed. Tub/ Shower Transfer: Modified independent;Ambulation   Functional mobility during ADLs: Modified independent General ADL Comments: Pt overall Mod I for ADLs. Educated and demonstrated understanding of compensatory strategies.... Requiring min A to bring button up shirt around back but reporting husband can assist with this at home.     Vision   Vision Assessment?: No apparent visual deficits     Perception Perception Perception Tested?: No Comments: no apparent perceptual deficits.   Praxis Praxis Praxis tested?: Not tested Praxis-Other  Comments: no apparent difficulty with motor  planning    Pertinent Vitals/Pain Pain Assessment: Faces Faces Pain Scale: Hurts a little bit Pain Location: neck Pain Descriptors / Indicators: Operative site guarding Pain Intervention(s): Limited activity within patient's tolerance;Monitored during session     Hand Dominance     Extremity/Trunk Assessment Upper Extremity Assessment Upper Extremity Assessment: Generalized weakness (reporting improvement in R hand numbness)   Lower Extremity Assessment Lower Extremity Assessment: Defer to PT evaluation   Cervical / Trunk Assessment Cervical / Trunk Assessment: Other exceptions Cervical / Trunk Exceptions: s/p cervical fusion c7-T1   Communication Communication Communication: No difficulties   Cognition Arousal/Alertness: Awake/alert Behavior During Therapy: WFL for tasks assessed/performed Overall Cognitive Status: Within Functional Limits for tasks assessed                                 General Comments: Pt pleasant and conversational throughout session.   General Comments  Pt initially unaware of neck precautions. Pt educated and verbalized understanding.    Exercises     Shoulder Instructions      Home Living Family/patient expects to be discharged to:: Private residence Living Arrangements: Spouse/significant other Available Help at Discharge: Family;Available 24 hours/day Type of Home: House Home Access: Level entry     Home Layout: Two level;Able to live on main level with bedroom/bathroom Alternate Level Stairs-Number of Steps: flight Alternate Level Stairs-Rails: Right Bathroom Shower/Tub: Producer, television/film/video: Handicapped height     Home Equipment: Shower seat - built in;Walker - 2 wheels;Hand held shower head   Additional Comments: Pt reports husband is retired and available to Tax adviser      Prior Functioning/Environment Level of Independence: Independent                 OT Problem List: Decreased  strength;Decreased activity tolerance;Impaired balance (sitting and/or standing);Pain;Decreased knowledge of precautions      OT Treatment/Interventions:      OT Goals(Current goals can be found in the care plan section) Acute Rehab OT Goals Patient Stated Goal: home today OT Goal Formulation: With patient  OT Frequency:     Barriers to D/C:            Co-evaluation              AM-PAC OT "6 Clicks" Daily Activity     Outcome Measure Help from another person eating meals?: None Help from another person taking care of personal grooming?: None Help from another person toileting, which includes using toliet, bedpan, or urinal?: None Help from another person bathing (including washing, rinsing, drying)?: None Help from another person to put on and taking off regular upper body clothing?: A Little Help from another person to put on and taking off regular lower body clothing?: None 6 Click Score: 23   End of Session Equipment Utilized During Treatment: Gait belt Nurse Communication: Mobility status  Activity Tolerance: Patient tolerated treatment well Patient left: in bed;with call bell/phone within reach  OT Visit Diagnosis: Muscle weakness (generalized) (M62.81);Unsteadiness on feet (R26.81);Pain Pain - part of body:  (neck)                Time: 2952-8413 OT Time Calculation (min): 16 min Charges:     Ladene Artist, OTDS   Ladene Artist 02/01/2021, 1:02 PM

## 2021-02-06 ENCOUNTER — Encounter (HOSPITAL_COMMUNITY): Payer: Self-pay | Admitting: Neurosurgery

## 2021-08-07 IMAGING — CT CT CERVICAL SPINE W/O CM
2 series · 10 of 14 positions shown, 12 images · non-contrast
Comparison: Cervical spine CT 09/06/2020.

CLINICAL DATA: Neck pain.  ACDF on 10/20/2020.

EXAM:
CT CERVICAL SPINE WITHOUT CONTRAST
TECHNIQUE: Multidetector CT imaging of the cervical spine was performed without
intravenous contrast. Multiplanar CT image reconstructions were also
generated.

[Series 2: cspine soft · axial · 0.33mm/px · z∈[-215,-109]mm · 5 of 81 slices shown]
[im 14/81  soft-tissue]
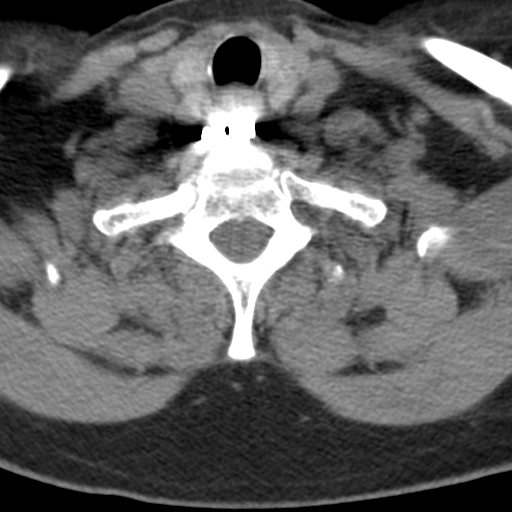
[im 27/81  soft-tissue]
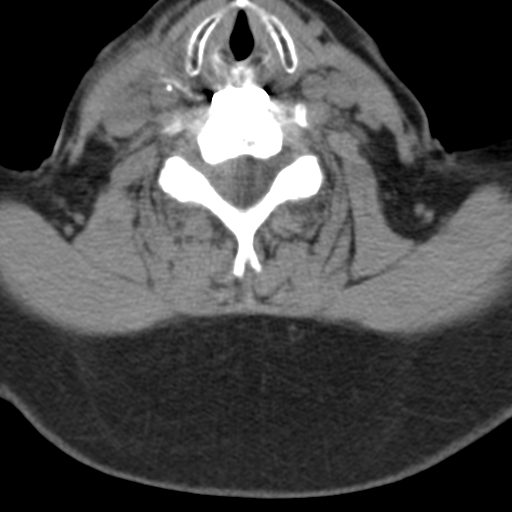
[im 41/81  soft-tissue]
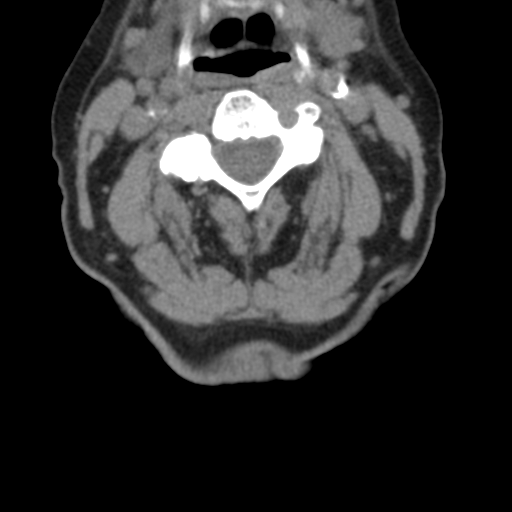
[im 54/81  soft-tissue]
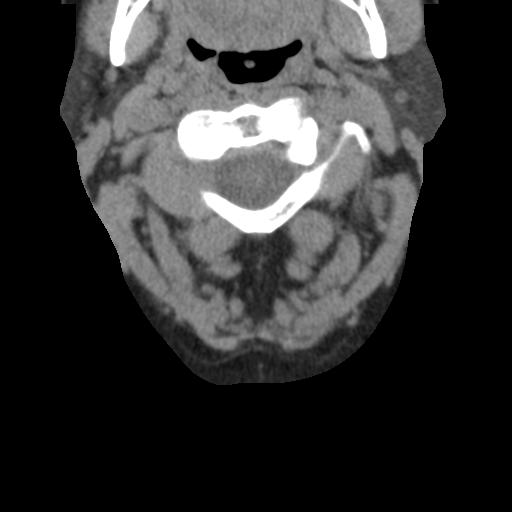
[im 67/81  soft-tissue]
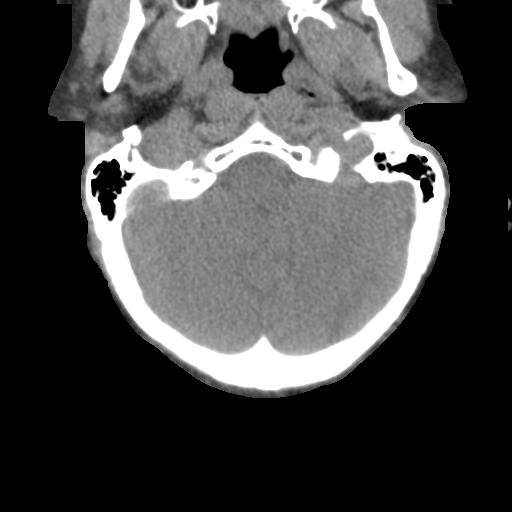

[Series 6: angled axial soft · axial · 0.29mm/px · z∈[-228,-117]mm · 5 of 86 slices shown, 7 images]
[im 15/86  soft-tissue]
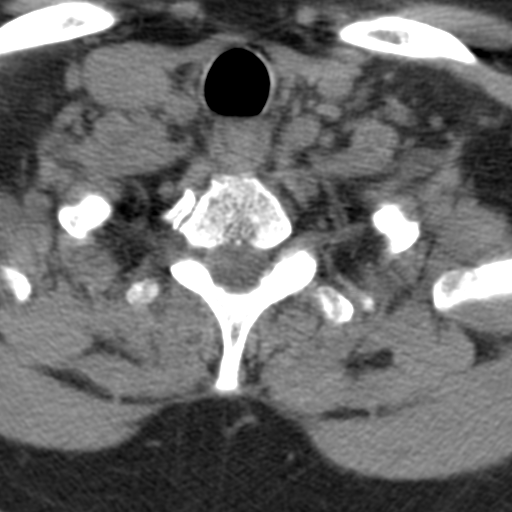
[im 15/86  bone]
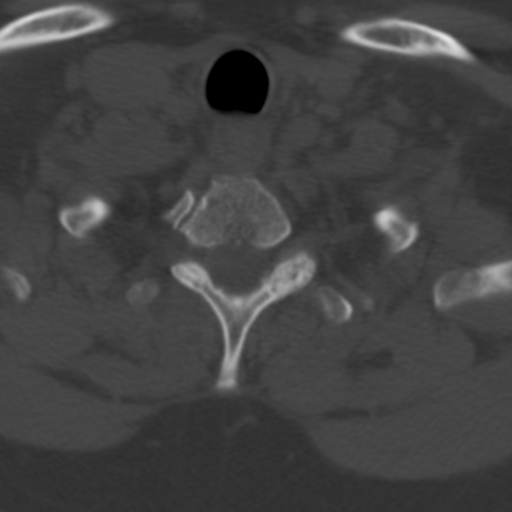
[im 29/86  bone]
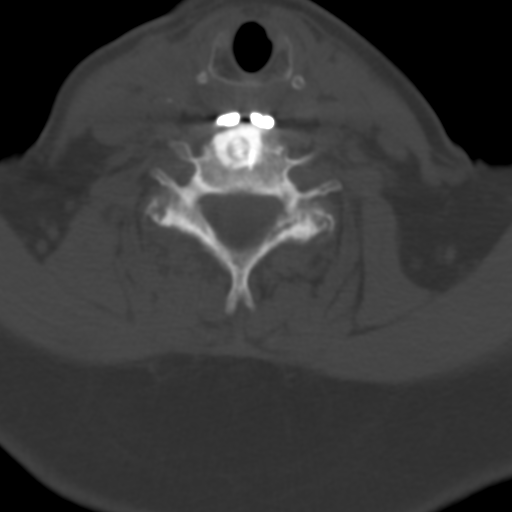
[im 43/86  bone]
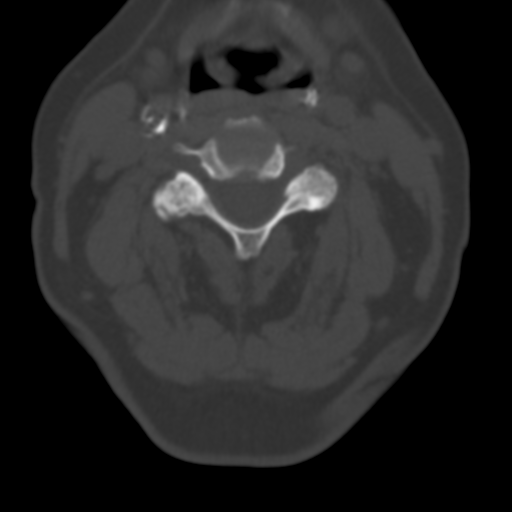
[im 57/86  bone]
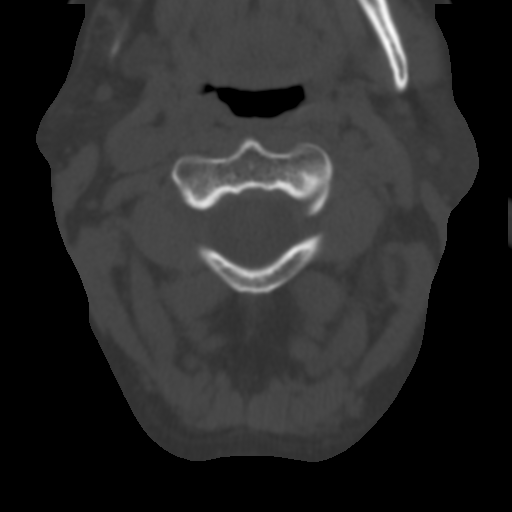
[im 71/86  soft-tissue]
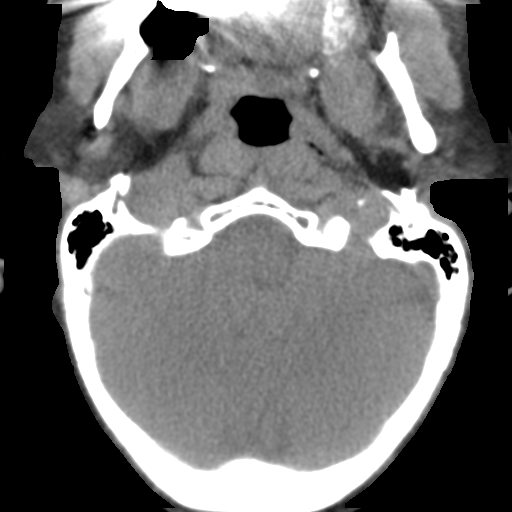
[im 71/86  bone]
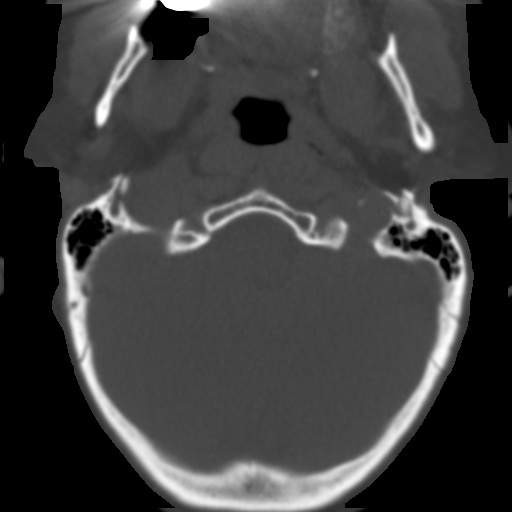

[10 of 14 positions shown; findings below may reference images not displayed]

Cervical spine
radiographs 11/28/2020. intraoperative fluoroscopic images
10/20/2020.
FINDINGS: Alignment: Unchanged minimal anterolisthesis of C2 on C3, C3 on C4,
and C7 on T1. Similar appearance of grade 1 anterolisthesis of C6 on
C7 following interval fusion.

Skull base and vertebrae: No acute fracture or suspicious osseous
lesion. Prior C4-C6 ACDF with robust interbody osseous fusion at
C5-6. At C4-5, there is persistent lucency through the posterior and
lateral aspects of the disc space, however the interbody graft
appears well incorporated.

There has been interval C6-7 ACDF since the prior CT. There is a gap
between the fusion plate and the anterior cortex of the C7 vertebral
body related to the C6-7 anterolisthesis, however the appearance is
similar to the recent prior radiographs as well as the
intraoperative fluoroscopic images. No lucency is seen about the
screws to indicate loosening. There is likely some early
incorporation of the C6-7 graft into the adjacent endplates.

Soft tissues and spinal canal: No prevertebral fluid or swelling. No
visible canal hematoma.

Disc levels:

C2-3: Severe facet arthrosis results in mild to moderate right
neural foraminal stenosis without spinal stenosis, unchanged.
Subchondral cysts or small erosions are noted along the left facet
joint.

C3-4: Anterolisthesis with bulging uncovered disc, uncovertebral
spurring, and severe facet arthrosis result in mild bilateral neural
foraminal stenosis without significant spinal stenosis, unchanged.

C4-5: ACDF. Right uncovertebral spurring and moderate facet
arthrosis result in mild right neural foraminal stenosis without
spinal stenosis, unchanged.

C5-6: ACDF.  No stenosis.

C6-7: ACDF. No evidence of significant spinal stenosis.
Uncovertebral and severe facet arthrosis result in mild-to-moderate
bilateral neural foraminal stenosis.

C7-T1: Anterolisthesis with bulging uncovered disc and moderate to
severe facet arthrosis result in mild bilateral neural foraminal
stenosis without evidence of significant spinal stenosis, unchanged.

Upper chest: Clear lung apices.

Other: Heavily calcified plaque at the carotid bifurcations.
IMPRESSION: 1. Recent C6-7 ACDF as above.
2. Solid C4-C6 ACDF.
3. Advanced cervical facet arthrosis with mild-to-moderate neural
foraminal stenosis as above.

## 2021-09-29 IMAGING — RF DG C-ARM 1-60 MIN
1 series · 2 of 2 positions shown · non-contrast
Comparison: CT cervical spine December 30, 2020.

CLINICAL DATA: Posterior cervical fusion with lateral mass fixation
C7-T1. Right-sided foraminotomy. Images were obtained for
localization during surgery. These are not the final images from the
fusion.

EXAM:
DG C-ARM 1-60 MIN; CERVICAL SPINE - 2-3 VIEW
FLUOROSCOPY TIME:  Fluoroscopy Time:  23 seconds
Radiation Exposure Index (if provided by the fluoroscopic device):
11
Number of Acquired Spot Images: 2

[Series 1: run · 2 of 2 slices shown]
[im 1/2]
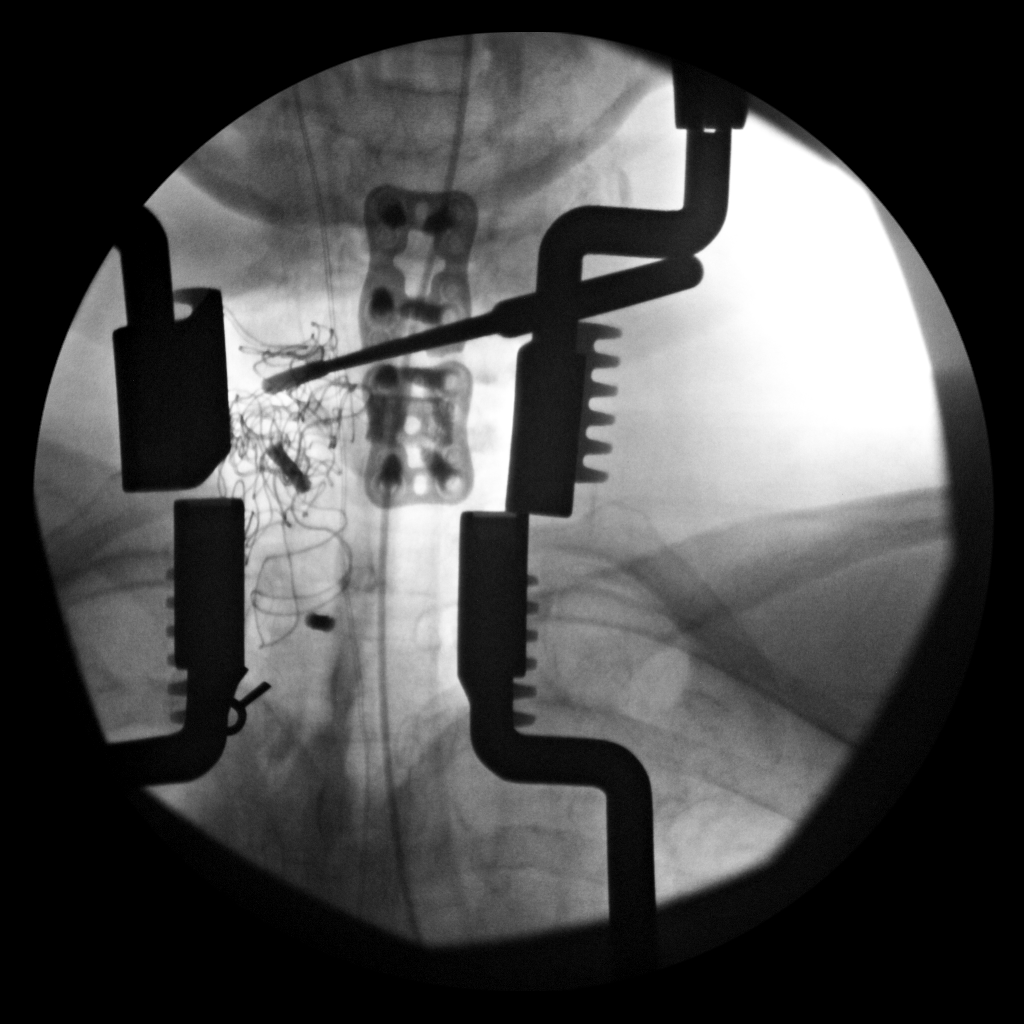
[im 2/2]
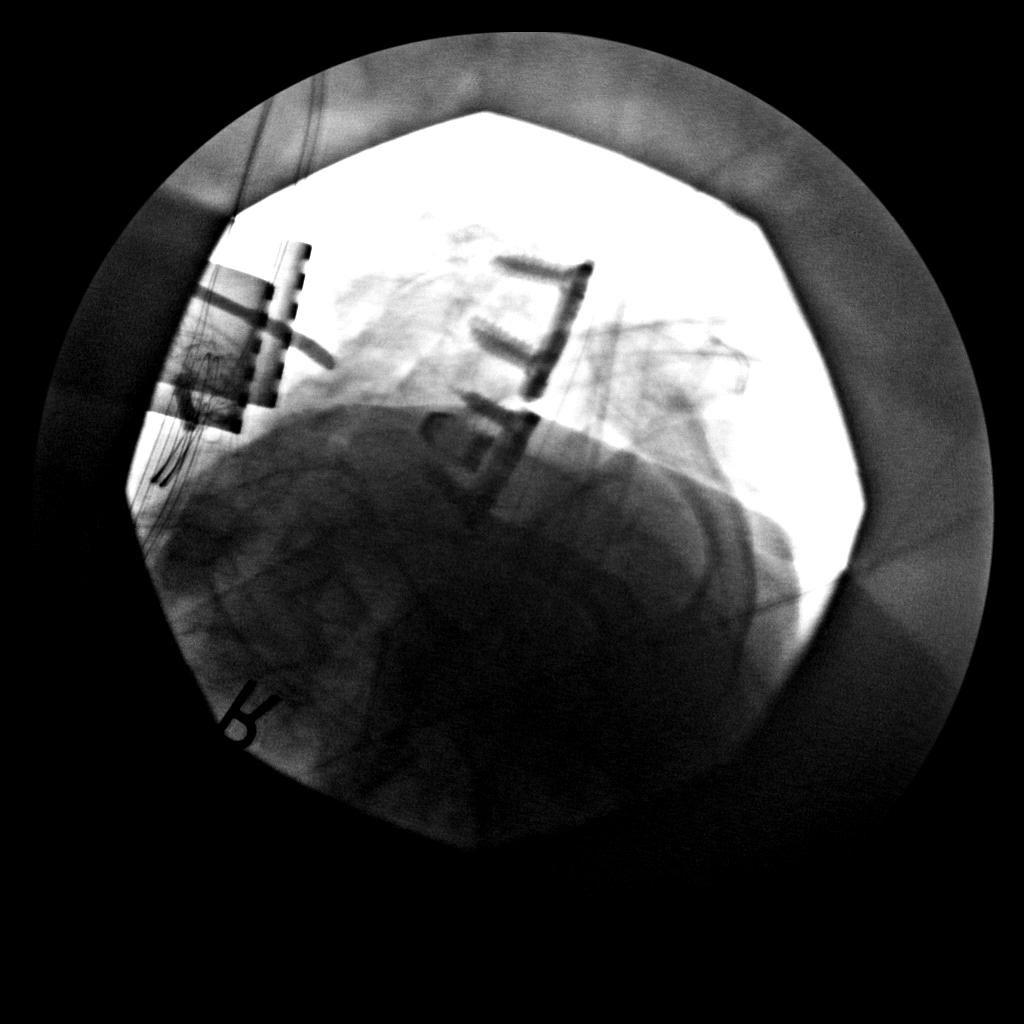

[2 of 2 positions shown; findings below may reference images not displayed]

FINDINGS: Two C-arm fluoroscopic images were obtained intraoperatively and
submitted for post operative interpretation. These images
demonstrate surgical probe projecting posteriorly at approximately
the C6-C7 level on the lateral. Prior C4-C5 and C6-C7 anterior plate
screw fixation with intervening graft at C6-C7, better characterized
on recent CT cervical spine. Please see the performing provider's
procedural report for further detail.
IMPRESSION: Intraoperative fluoroscopy, as detailed above.

## 2023-03-20 DIAGNOSIS — J40 Bronchitis, not specified as acute or chronic: Secondary | ICD-10-CM | POA: Diagnosis not present

## 2023-03-20 DIAGNOSIS — R058 Other specified cough: Secondary | ICD-10-CM | POA: Diagnosis not present

## 2023-03-20 DIAGNOSIS — J329 Chronic sinusitis, unspecified: Secondary | ICD-10-CM | POA: Diagnosis not present

## 2023-04-17 DIAGNOSIS — M25512 Pain in left shoulder: Secondary | ICD-10-CM | POA: Diagnosis not present

## 2023-04-17 DIAGNOSIS — M7061 Trochanteric bursitis, right hip: Secondary | ICD-10-CM | POA: Diagnosis not present

## 2023-04-17 DIAGNOSIS — M25551 Pain in right hip: Secondary | ICD-10-CM | POA: Diagnosis not present

## 2023-04-23 DIAGNOSIS — G8929 Other chronic pain: Secondary | ICD-10-CM | POA: Diagnosis not present

## 2023-04-23 DIAGNOSIS — G4700 Insomnia, unspecified: Secondary | ICD-10-CM | POA: Diagnosis not present

## 2023-04-23 DIAGNOSIS — M159 Polyosteoarthritis, unspecified: Secondary | ICD-10-CM | POA: Diagnosis not present

## 2023-04-23 DIAGNOSIS — Z7689 Persons encountering health services in other specified circumstances: Secondary | ICD-10-CM | POA: Diagnosis not present

## 2023-04-23 DIAGNOSIS — G43C0 Periodic headache syndromes in child or adult, not intractable: Secondary | ICD-10-CM | POA: Diagnosis not present

## 2023-04-23 DIAGNOSIS — M5090 Cervical disc disorder, unspecified, unspecified cervical region: Secondary | ICD-10-CM | POA: Diagnosis not present

## 2023-04-23 DIAGNOSIS — M545 Low back pain, unspecified: Secondary | ICD-10-CM | POA: Diagnosis not present

## 2023-04-23 DIAGNOSIS — I2510 Atherosclerotic heart disease of native coronary artery without angina pectoris: Secondary | ICD-10-CM | POA: Diagnosis not present

## 2023-04-23 DIAGNOSIS — E039 Hypothyroidism, unspecified: Secondary | ICD-10-CM | POA: Diagnosis not present

## 2023-06-10 DIAGNOSIS — G250 Essential tremor: Secondary | ICD-10-CM | POA: Diagnosis not present

## 2023-06-17 DIAGNOSIS — M65912 Unspecified synovitis and tenosynovitis, left shoulder: Secondary | ICD-10-CM | POA: Diagnosis not present

## 2023-06-27 DIAGNOSIS — M7582 Other shoulder lesions, left shoulder: Secondary | ICD-10-CM | POA: Diagnosis not present

## 2023-06-27 DIAGNOSIS — M129 Arthropathy, unspecified: Secondary | ICD-10-CM | POA: Diagnosis not present

## 2023-06-27 DIAGNOSIS — M25412 Effusion, left shoulder: Secondary | ICD-10-CM | POA: Diagnosis not present

## 2023-06-27 DIAGNOSIS — S46812A Strain of other muscles, fascia and tendons at shoulder and upper arm level, left arm, initial encounter: Secondary | ICD-10-CM | POA: Diagnosis not present

## 2023-06-27 DIAGNOSIS — M24812 Other specific joint derangements of left shoulder, not elsewhere classified: Secondary | ICD-10-CM | POA: Diagnosis not present

## 2023-07-29 DIAGNOSIS — M65912 Unspecified synovitis and tenosynovitis, left shoulder: Secondary | ICD-10-CM | POA: Diagnosis not present

## 2023-08-15 DIAGNOSIS — Z1231 Encounter for screening mammogram for malignant neoplasm of breast: Secondary | ICD-10-CM | POA: Diagnosis not present

## 2023-09-02 DIAGNOSIS — J329 Chronic sinusitis, unspecified: Secondary | ICD-10-CM | POA: Diagnosis not present

## 2023-09-02 DIAGNOSIS — J40 Bronchitis, not specified as acute or chronic: Secondary | ICD-10-CM | POA: Diagnosis not present
